# Patient Record
Sex: Male | Born: 1945 | Race: White | Hispanic: No | Smoking: Former smoker
Health system: Southern US, Community
[De-identification: ages and names within clinical notes are randomized; demographics above are authoritative.]

## PROBLEM LIST (undated history)

## (undated) ENCOUNTER — Emergency Department (HOSPITAL_COMMUNITY): Payer: Medicare HMO

## (undated) DIAGNOSIS — R0600 Dyspnea, unspecified: Secondary | ICD-10-CM

## (undated) DIAGNOSIS — K429 Umbilical hernia without obstruction or gangrene: Secondary | ICD-10-CM

## (undated) DIAGNOSIS — T8859XA Other complications of anesthesia, initial encounter: Secondary | ICD-10-CM

## (undated) DIAGNOSIS — G2581 Restless legs syndrome: Secondary | ICD-10-CM

## (undated) DIAGNOSIS — T4145XA Adverse effect of unspecified anesthetic, initial encounter: Secondary | ICD-10-CM

## (undated) DIAGNOSIS — J45909 Unspecified asthma, uncomplicated: Secondary | ICD-10-CM

## (undated) DIAGNOSIS — J310 Chronic rhinitis: Secondary | ICD-10-CM

## (undated) HISTORY — PX: EYE SURGERY: SHX253

## (undated) HISTORY — PX: ADENOIDECTOMY: SUR15

## (undated) HISTORY — DX: Dyspnea, unspecified: R06.00

## (undated) HISTORY — DX: Chronic rhinitis: J31.0

## (undated) HISTORY — PX: TONSILLECTOMY: SUR1361

---
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
## 2000-12-07 ENCOUNTER — Emergency Department (HOSPITAL_COMMUNITY): Admission: EM | Admit: 2000-12-07 | Discharge: 2000-12-08 | Payer: Self-pay

## 2000-12-09 ENCOUNTER — Encounter: Admission: RE | Admit: 2000-12-09 | Discharge: 2000-12-09 | Payer: Self-pay | Admitting: Urology

## 2000-12-09 ENCOUNTER — Encounter: Payer: Self-pay | Admitting: Urology

## 2000-12-11 ENCOUNTER — Encounter: Payer: Self-pay | Admitting: Urology

## 2000-12-12 ENCOUNTER — Ambulatory Visit (HOSPITAL_COMMUNITY): Admission: RE | Admit: 2000-12-12 | Discharge: 2000-12-12 | Payer: Self-pay | Admitting: Urology

## 2000-12-12 ENCOUNTER — Encounter: Payer: Self-pay | Admitting: Urology

## 2000-12-17 ENCOUNTER — Encounter: Admission: RE | Admit: 2000-12-17 | Discharge: 2000-12-17 | Payer: Self-pay | Admitting: Urology

## 2000-12-17 ENCOUNTER — Encounter: Payer: Self-pay | Admitting: Urology

## 2000-12-18 ENCOUNTER — Ambulatory Visit (HOSPITAL_COMMUNITY): Admission: RE | Admit: 2000-12-18 | Discharge: 2000-12-18 | Payer: Self-pay | Admitting: Urology

## 2000-12-31 ENCOUNTER — Encounter: Admission: RE | Admit: 2000-12-31 | Discharge: 2000-12-31 | Payer: Self-pay | Admitting: Urology

## 2000-12-31 ENCOUNTER — Encounter: Payer: Self-pay | Admitting: Urology

## 2001-01-10 ENCOUNTER — Encounter: Payer: Self-pay | Admitting: Urology

## 2001-01-10 ENCOUNTER — Encounter: Admission: RE | Admit: 2001-01-10 | Discharge: 2001-01-10 | Payer: Self-pay | Admitting: Urology

## 2001-10-22 ENCOUNTER — Encounter: Admission: RE | Admit: 2001-10-22 | Discharge: 2001-10-22 | Payer: Self-pay | Admitting: Urology

## 2001-10-22 ENCOUNTER — Encounter: Payer: Self-pay | Admitting: Urology

## 2002-05-26 ENCOUNTER — Encounter: Payer: Self-pay | Admitting: Family Medicine

## 2002-05-26 ENCOUNTER — Ambulatory Visit (HOSPITAL_COMMUNITY): Admission: RE | Admit: 2002-05-26 | Discharge: 2002-05-26 | Payer: Self-pay | Admitting: Family Medicine

## 2002-06-08 ENCOUNTER — Encounter: Payer: Self-pay | Admitting: Family Medicine

## 2002-06-08 ENCOUNTER — Ambulatory Visit (HOSPITAL_COMMUNITY): Admission: RE | Admit: 2002-06-08 | Discharge: 2002-06-08 | Payer: Self-pay | Admitting: Family Medicine

## 2009-03-25 ENCOUNTER — Ambulatory Visit (HOSPITAL_COMMUNITY): Admission: RE | Admit: 2009-03-25 | Discharge: 2009-03-25 | Payer: Self-pay | Admitting: Family Medicine

## 2010-09-22 NOTE — Op Note (Signed)
[facility]  Patient:    Joe Gonzales, Joe Gonzales                   MRN: [id number] Proc. Date: 12/18/00 Adm. Date:  [id number] Attending:  Thermon Leyland                           Operative Report  PREOPERATIVE DIAGNOSIS:  Left proximal to mid ureteral calculus.  POSTOPERATIVE DIAGNOSIS:  Left proximal to mid urethral calculus.  PROCEDURE PERFORMED:  Cystoscopy, ureteroscopy, holmium laser lithotripsy, stone-basketing of fragments, and double-J stent placement.  SURGEON:  Barron Alvine, M.D.  ANESTHESIA:  General.  INDICATIONS:  [name] is a 65 year old male.  He presented with significant left flank pain and had a 6 mm left UPJ stone.  He subsequently had lithotripsy, but the stone did not fragment.  It has subsequently migrated distally a few centimeters, and he has continued to have significant discomfort.  We discussed with him the chance of spontaneous passage, and he elected to proceed with ureteroscopy and holmium laser lithotripsy.  TECHNIQUE AND FINDINGS:  The patient was brought to the operating room where he underwent successful induction of general anesthesia.  He was placed in the lithotomy position, and prepped and draped in the usual manner.  Cystoscopy revealed unremarkable bladder.  I did not perform retrograde pyelography because I could easily see the stone with fluoroscopy.  With the guidewire, we were able to gently place the Glidewire past the stone and into the renal pelvis.  The distal ureter required some dilation with a 10 cm balloon dilator.  We had initially attempted to engage the ureter but found it to be too tight to allow for advancement of the scope.  We were then able to use the 6.5 [place] long rigid ureteroscope.  The stone itself was encountered in the proximal ureter.  The stone was approximately 6 mm in size.  The appearance of the stone was consistent with a calcium oxalate monohydrated stone.   Holmium laser lithotripsy was then used to break the stone into about 6-8 pieces.  The largest of these were basketed.  Approximately 4-5 of these pieces were basketed, each measuring 3-4 mm in size.  No addition fragments remained other than the occasional little piece, 1 mm or less in size.  No obvious injury to ureter occurred, and the procedure went very smoothly.  Over the guidewire, a 7 French 24 cm double-J stent was placed.  The patient was brought to the recovery room in stable condition. DD:  12/18/00 TD:  12/18/00 Job: [id number]

## 2014-09-10 ENCOUNTER — Other Ambulatory Visit: Payer: Self-pay | Admitting: Family Medicine

## 2014-09-10 DIAGNOSIS — Z139 Encounter for screening, unspecified: Secondary | ICD-10-CM

## 2014-09-17 ENCOUNTER — Ambulatory Visit: Payer: Self-pay

## 2014-09-24 ENCOUNTER — Other Ambulatory Visit: Payer: Self-pay | Admitting: Family Medicine

## 2014-09-24 ENCOUNTER — Ambulatory Visit
Admission: RE | Admit: 2014-09-24 | Discharge: 2014-09-24 | Disposition: A | Discharge status: Home / Self Care | Payer: Self-pay | Source: Ambulatory Visit | Attending: Family Medicine | Admitting: Family Medicine

## 2014-09-24 DIAGNOSIS — Z139 Encounter for screening, unspecified: Secondary | ICD-10-CM

## 2015-09-13 ENCOUNTER — Other Ambulatory Visit: Payer: Self-pay | Admitting: Family Medicine

## 2015-09-13 DIAGNOSIS — M545 Low back pain: Secondary | ICD-10-CM

## 2015-09-15 ENCOUNTER — Ambulatory Visit
Admission: RE | Admit: 2015-09-15 | Discharge: 2015-09-15 | Disposition: A | Discharge status: Home / Self Care | Payer: Medicare HMO | Source: Ambulatory Visit | Attending: Family Medicine | Admitting: Family Medicine

## 2015-09-15 DIAGNOSIS — M545 Low back pain: Secondary | ICD-10-CM

## 2016-05-15 DIAGNOSIS — D225 Melanocytic nevi of trunk: Secondary | ICD-10-CM | POA: Diagnosis not present

## 2016-05-15 DIAGNOSIS — Z86018 Personal history of other benign neoplasm: Secondary | ICD-10-CM | POA: Diagnosis not present

## 2016-05-15 DIAGNOSIS — D2262 Melanocytic nevi of left upper limb, including shoulder: Secondary | ICD-10-CM | POA: Diagnosis not present

## 2016-05-15 DIAGNOSIS — L821 Other seborrheic keratosis: Secondary | ICD-10-CM | POA: Diagnosis not present

## 2016-05-15 DIAGNOSIS — D2230 Melanocytic nevi of unspecified part of face: Secondary | ICD-10-CM | POA: Diagnosis not present

## 2016-05-15 DIAGNOSIS — Z85828 Personal history of other malignant neoplasm of skin: Secondary | ICD-10-CM | POA: Diagnosis not present

## 2016-05-15 DIAGNOSIS — Z23 Encounter for immunization: Secondary | ICD-10-CM | POA: Diagnosis not present

## 2016-05-28 DIAGNOSIS — M9900 Segmental and somatic dysfunction of head region: Secondary | ICD-10-CM | POA: Diagnosis not present

## 2016-05-28 DIAGNOSIS — M4803 Spinal stenosis, cervicothoracic region: Secondary | ICD-10-CM | POA: Diagnosis not present

## 2016-05-28 DIAGNOSIS — M9905 Segmental and somatic dysfunction of pelvic region: Secondary | ICD-10-CM | POA: Diagnosis not present

## 2016-05-28 DIAGNOSIS — M9903 Segmental and somatic dysfunction of lumbar region: Secondary | ICD-10-CM | POA: Diagnosis not present

## 2016-08-29 DIAGNOSIS — G2581 Restless legs syndrome: Secondary | ICD-10-CM | POA: Diagnosis not present

## 2016-08-29 DIAGNOSIS — Z6827 Body mass index (BMI) 27.0-27.9, adult: Secondary | ICD-10-CM | POA: Diagnosis not present

## 2016-08-29 DIAGNOSIS — M5135 Other intervertebral disc degeneration, thoracolumbar region: Secondary | ICD-10-CM | POA: Diagnosis not present

## 2016-08-29 DIAGNOSIS — Z79899 Other long term (current) drug therapy: Secondary | ICD-10-CM | POA: Diagnosis not present

## 2016-08-29 DIAGNOSIS — M48061 Spinal stenosis, lumbar region without neurogenic claudication: Secondary | ICD-10-CM | POA: Diagnosis not present

## 2016-08-29 DIAGNOSIS — R292 Abnormal reflex: Secondary | ICD-10-CM | POA: Diagnosis not present

## 2016-08-29 DIAGNOSIS — Z0000 Encounter for general adult medical examination without abnormal findings: Secondary | ICD-10-CM | POA: Diagnosis not present

## 2016-08-29 DIAGNOSIS — Z791 Long term (current) use of non-steroidal anti-inflammatories (NSAID): Secondary | ICD-10-CM | POA: Diagnosis not present

## 2016-08-29 DIAGNOSIS — H9110 Presbycusis, unspecified ear: Secondary | ICD-10-CM | POA: Diagnosis not present

## 2016-08-29 DIAGNOSIS — J309 Allergic rhinitis, unspecified: Secondary | ICD-10-CM | POA: Diagnosis not present

## 2016-09-14 DIAGNOSIS — M150 Primary generalized (osteo)arthritis: Secondary | ICD-10-CM

## 2016-09-14 DIAGNOSIS — M8949 Other hypertrophic osteoarthropathy, multiple sites: Secondary | ICD-10-CM | POA: Insufficient documentation

## 2016-09-14 DIAGNOSIS — M159 Polyosteoarthritis, unspecified: Secondary | ICD-10-CM | POA: Insufficient documentation

## 2016-09-14 DIAGNOSIS — G2581 Restless legs syndrome: Secondary | ICD-10-CM | POA: Insufficient documentation

## 2016-09-14 DIAGNOSIS — Z7721 Contact with and (suspected) exposure to potentially hazardous body fluids: Secondary | ICD-10-CM | POA: Diagnosis not present

## 2016-09-14 DIAGNOSIS — Z0000 Encounter for general adult medical examination without abnormal findings: Secondary | ICD-10-CM | POA: Diagnosis not present

## 2016-09-14 DIAGNOSIS — Z8249 Family history of ischemic heart disease and other diseases of the circulatory system: Secondary | ICD-10-CM | POA: Diagnosis not present

## 2016-09-14 DIAGNOSIS — Z1211 Encounter for screening for malignant neoplasm of colon: Secondary | ICD-10-CM | POA: Diagnosis not present

## 2016-09-14 DIAGNOSIS — E559 Vitamin D deficiency, unspecified: Secondary | ICD-10-CM | POA: Diagnosis not present

## 2016-10-13 DIAGNOSIS — Z8249 Family history of ischemic heart disease and other diseases of the circulatory system: Secondary | ICD-10-CM | POA: Diagnosis not present

## 2016-10-19 DIAGNOSIS — D125 Benign neoplasm of sigmoid colon: Secondary | ICD-10-CM | POA: Diagnosis not present

## 2016-10-19 DIAGNOSIS — Z86010 Personal history of colonic polyps: Secondary | ICD-10-CM | POA: Diagnosis not present

## 2016-11-17 DIAGNOSIS — Z0100 Encounter for examination of eyes and vision without abnormal findings: Secondary | ICD-10-CM | POA: Diagnosis not present

## 2016-11-21 DIAGNOSIS — D225 Melanocytic nevi of trunk: Secondary | ICD-10-CM | POA: Diagnosis not present

## 2016-11-21 DIAGNOSIS — Z808 Family history of malignant neoplasm of other organs or systems: Secondary | ICD-10-CM | POA: Diagnosis not present

## 2016-11-21 DIAGNOSIS — D2230 Melanocytic nevi of unspecified part of face: Secondary | ICD-10-CM | POA: Diagnosis not present

## 2016-11-21 DIAGNOSIS — L821 Other seborrheic keratosis: Secondary | ICD-10-CM | POA: Diagnosis not present

## 2016-11-21 DIAGNOSIS — D2262 Melanocytic nevi of left upper limb, including shoulder: Secondary | ICD-10-CM | POA: Diagnosis not present

## 2017-02-28 DIAGNOSIS — J209 Acute bronchitis, unspecified: Secondary | ICD-10-CM | POA: Diagnosis not present

## 2017-04-11 DIAGNOSIS — R69 Illness, unspecified: Secondary | ICD-10-CM | POA: Diagnosis not present

## 2017-04-11 DIAGNOSIS — F411 Generalized anxiety disorder: Secondary | ICD-10-CM | POA: Diagnosis not present

## 2017-04-25 DIAGNOSIS — F411 Generalized anxiety disorder: Secondary | ICD-10-CM | POA: Diagnosis not present

## 2017-04-25 DIAGNOSIS — R69 Illness, unspecified: Secondary | ICD-10-CM | POA: Diagnosis not present

## 2018-07-03 ENCOUNTER — Ambulatory Visit: Payer: Medicare Other | Admitting: Allergy & Immunology

## 2018-07-03 ENCOUNTER — Encounter: Payer: Self-pay | Admitting: Allergy & Immunology

## 2018-07-03 VITALS — BP 126/70 | HR 77 | Resp 16 | Ht 67.0 in | Wt 201.8 lb

## 2018-07-03 DIAGNOSIS — J4540 Moderate persistent asthma, uncomplicated: Secondary | ICD-10-CM | POA: Diagnosis not present

## 2018-07-03 DIAGNOSIS — J310 Chronic rhinitis: Secondary | ICD-10-CM | POA: Diagnosis not present

## 2018-07-03 MED ORDER — BUDESONIDE-FORMOTEROL FUMARATE 160-4.5 MCG/ACT IN AERO: 2.0000 | INHALATION_SPRAY | Freq: Two times a day (BID) | RESPIRATORY_TRACT | 5 refills | Status: DC

## 2018-07-03 MED ORDER — FLUTICASONE PROPIONATE 50 MCG/ACT NA SUSP: 2.0000 | Freq: Every day | NASAL | 5 refills | Status: DC

## 2018-07-03 MED ORDER — ALBUTEROL SULFATE HFA 108 (90 BASE) MCG/ACT IN AERS: 1.0000 | INHALATION_SPRAY | RESPIRATORY_TRACT | 1 refills | Status: AC | PRN

## 2018-07-03 NOTE — Patient Instructions (Addendum)
1. Possible asthma  - Lung testing looked good today and it did not improve much with the nebulizer treatment. - You did feel better with the ProAir in the past, which does point towards asthma. - However, I think a trial of an asthma medication would be useful to see how things go.  - We are going to start you on Symbicort two puffs twice daily. - Spacer sample and demonstration provided. - Daily controller medication(s): Symbicort 160/4.71mcg two puffs twice daily with spacer - Prior to physical activity: ProAir 2 puffs 10-15 minutes before physical activity. - Rescue medications: ProAir 4 puffs every 4-6 hours as needed - Asthma control goals:  * Full participation in all desired activities (may need albuterol before activity) * Albuterol use two time or less a week on average (not counting use with activity) * Cough interfering with sleep two time or less a month * Oral steroids no more than once a year * No hospitalizations  2. Chronic rhinitis - We could not do testing since the histamine was non-reactive. - We are going to do testing on March 4th at 9:00am with Gareth Morgan our NP - Stop taking: Allegra for now - Continue with: Flonase (fluticasone) one spray per nostril daily - We can discuss additional treatment options once we have the testing in hand.   3. Return in about 6 days (around 07/09/2018) for SKIN TESTING (9AM).   Please inform us of any Emergency Department visits, hospitalizations, or changes in symptoms. Call us before going to the ED for breathing or allergy symptoms since we might be able to fit you in for a sick visit. Feel free to contact us anytime with any questions, problems, or concerns.  It was a pleasure to meet you today!  Websites that have reliable patient information: 1. American Academy of Asthma, Allergy, and Immunology: www.aaaai.org 2. Food Allergy Research and Education (FARE): foodallergy.org 3. Mothers of Asthmatics:  http://www.asthmacommunitynetwork.org 4. American College of Allergy, Asthma, and Immunology: MonthlyElectricBill.co.uk   Make sure you are registered to vote! If you have moved or changed any of your contact information, you will need to get this updated before voting!    Voter ID laws are NOT going into effect for the General Election in November 2020! DO NOT let this stop you from exercising your right to vote!

## 2018-07-03 NOTE — Progress Notes (Signed)
NEW PATIENT  Date of Service/Encounter:  07/03/18  Referring provider: Chesley Noon, MD   Assessment:   Shortness of breath - possible asthma  Chronic rhinitis   Likely tannin-mediated congestion with red wine exposure   Plan/Recommendations:   1. Possible asthma  - Lung testing looked good today and it did not improve much with the nebulizer treatment. - You did feel better with the ProAir in the past, which does point towards asthma. - However, I think a trial of an asthma medication would be useful to see how things go.  - We are going to start you on Symbicort two puffs twice daily. - Spacer sample and demonstration provided. - Daily controller medication(s): Symbicort 160/4.64mcg two puffs twice daily with spacer - Prior to physical activity: ProAir 2 puffs 10-15 minutes before physical activity. - Rescue medications: ProAir 4 puffs every 4-6 hours as needed - Asthma control goals:  * Full participation in all desired activities (may need albuterol before activity) * Albuterol use two time or less a week on average (not counting use with activity) * Cough interfering with sleep two time or less a month * Oral steroids no more than once a year * No hospitalizations  2. Chronic rhinitis - We could not do testing since the histamine was non-reactive. - We are going to do testing on March 4th at 9:00am with Gareth Morgan our NP - Stop taking: Allegra for now - Continue with: Flonase (fluticasone) one spray per nostril daily - We can discuss additional treatment options once we have the testing in hand.   3. Return in about 6 days (around 07/09/2018) for SKIN TESTING (9AM).   Subjective:   Joe Gonzales is a 73 y.o. male presenting today for evaluation of  Chief Complaint  Patient presents with  . Chest Pain  . Shortness of Breath  . Cough  . Allergic Rhinitis     Joe Gonzales has a history of the following: Patient Active Problem List   Diagnosis Date Noted  . Chronic rhinitis 07/03/2018  . Moderate persistent asthma, uncomplicated 66/44/0347    History obtained from: chart review and patient.  Joe Gonzales was referred by Chesley Noon, MD.     Joe Gonzales is a 73 y.o. male presenting for an evaluation of shortness of breath and allergies.   Asthma/Respiratory Symptom History: He has never been diagnosed officially with asthma.  He was a smoker himself for 4 to 5 years, but quit around 38 years ago.  He was also around quite a bit of cigarette smoke growing up, as his parents were "chain smokers".  As a child, he had bronchitis multiple times throughout the year.  He also had pleurisy twice as a Electronics engineer.  Over time, his pulmonary issues have calmed down.  He does not remember ever having an inhaler as a child.  He did get prednisone quite a bit during these episodes of chronic bronchitis as a child.  However, in his 33s, 59s, 54s, and 27s he has done very well.  His current symptoms started in October 2019.  He tells me that he contracted a cold which never seem to get better.  He reports that he has right sided pressure in his chest such that he cannot take a deep breath.  He is also had 2 to 3 months of throat clearing with a dry cough.  He has been treated with prednisone tapers on 2 different occasions since October.  He  initially has improvement with the first couple of days of steroids, but as they are tapered his symptoms get worse.  He did get an albuterol inhaler which he is used up.  It was providing some relief.  He has never been given a maintenance inhaler.  He thinks that he sleeps well at night, although with the onset of symptoms he has been unable to go to the gym and has put on a little bit of weight.  He feels that the extra weight is not helping his situation at all.  Allergic Rhinitis Symptom History: He does have a history of allergic rhinitis.  He reports that this started when he moved from [place] to [place] in 1995.  Specifically, when he mows his lawn outside, he has copious sneezing and clear rhinorrhea.  He has never been tested in the past, but his symptoms have responded well to over-the-counter antihistamines.  Currently he is on fexofenadine.  He has intermittent congestion and is never used a nasal spray.  He does not get sick very often, typically getting 1 cold per year that does not require antibiotics often.  He does not think that he has any problems with exposure to animals.  Most of his symptoms have been centered around the spring and summer season, but over time they have become more year-round.  He does report nasal congestion with red wines, otherwise he tolerates foods without a problem.  Otherwise, there is no history of other atopic diseases, including food allergies, drug allergies, stinging insect allergies, eczema, urticaria or contact dermatitis. There is no significant infectious history. Vaccinations are up to date.    Past Medical History: Patient Active Problem List   Diagnosis Date Noted  . Chronic rhinitis 07/03/2018  . Moderate persistent asthma, uncomplicated 02/72/5366    Medication List:  Allergies as of 07/03/2018   No Known Allergies     Medication List       Accurate as of July 03, 2018 10:57 AM. Always use your most recent med list.        fluticasone 50 MCG/ACT nasal spray Commonly known as:  FLONASE USE 2 SPRAY(S) IN EACH NOSTRIL ONCE DAILY   meloxicam 15 MG tablet Commonly known as:  MOBIC Take by mouth.   rOPINIRole 3 MG tablet Commonly known as:  REQUIP Take 3 mg by mouth at bedtime.   Vitamin D 50 MCG (2000 UT) Caps Take by mouth.       Birth History: non-contributory  Developmental History: non-contributory.   Past Surgical History: Past Surgical History:  Procedure Laterality Date  . ADENOIDECTOMY    . EYE SURGERY    . TONSILLECTOMY       Family History: History reviewed. No pertinent  family history.   Social History: Joe Gonzales lives at home by himself.  He is divorced.  He lives in a house that is 72 years old.  There is hardwood in the main living areas and carpeting in the bedrooms.  He has gas heating and central cooling.  There cats outside of the home, who belonged to the neighbors.  There is no current tobacco exposure.  He does have dust mite covers on his bedding.  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  He retired for a total of 2 months but then became bored.   Review of Systems  Constitutional: Negative.  Negative for fever,  malaise/fatigue and weight loss.  HENT: Negative.  Negative for congestion, ear discharge and ear pain.   Eyes: Negative for pain, discharge and redness.  Respiratory: Positive for cough and shortness of breath. Negative for sputum production and wheezing.        Positive for right-sided chest pain with deep inspiration.  Cardiovascular: Negative.  Negative for chest pain and palpitations.  Gastrointestinal: Negative for abdominal pain and heartburn.  Skin: Negative.  Negative for itching and rash.  Neurological: Negative for dizziness and headaches.  Endo/Heme/Allergies: Positive for environmental allergies. Does not bruise/bleed easily.       Objective:   Blood pressure 126/70, pulse 77, resp. rate 16, height 5\' 7"  (1.702 m), weight 201 lb 12.8 oz (91.5 kg), SpO2 98 %. Body mass index is 31.61 kg/m.   Physical Exam:   Physical Exam  Constitutional: He appears well-developed.  HENT:  Head: Normocephalic and atraumatic.  Right Ear: Tympanic membrane, external ear and ear canal normal. No drainage, swelling or tenderness. Tympanic membrane is not injected, not scarred, not erythematous, not retracted and not bulging.  Left Ear: Tympanic membrane, external ear and ear canal normal. No drainage, swelling or tenderness. Tympanic membrane is not  injected, not scarred, not erythematous, not retracted and not bulging.  Nose: No mucosal edema, rhinorrhea, nasal deformity or septal deviation. No epistaxis. Right sinus exhibits no maxillary sinus tenderness and no frontal sinus tenderness. Left sinus exhibits no maxillary sinus tenderness and no frontal sinus tenderness.  Mouth/Throat: Uvula is midline and oropharynx is clear and moist. Mucous membranes are not pale and not dry.  Eyes: Pupils are equal, round, and reactive to light. Conjunctivae and EOM are normal. Right eye exhibits no chemosis and no discharge. Left eye exhibits no chemosis and no discharge. Right conjunctiva is not injected. Left conjunctiva is not injected.  Cardiovascular: Normal rate, regular rhythm and normal heart sounds.  Respiratory: Effort normal and breath sounds normal. No accessory muscle usage. No tachypnea. No respiratory distress. He has no wheezes. He has no rhonchi. He has no rales. He exhibits no tenderness.  Moving air fairly well in all lung fields.  No increased work of breathing.  GI: There is no abdominal tenderness. There is no rebound and no guarding.  Lymphadenopathy:       Head (right side): No submandibular, no tonsillar and no occipital adenopathy present.       Head (left side): No submandibular, no tonsillar and no occipital adenopathy present.    He has no cervical adenopathy.  Neurological: He is alert.  Skin: No abrasion, no petechiae and no rash noted. Rash is not papular, not vesicular and not urticarial. No erythema. No pallor.  Psychiatric: He has a normal mood and affect.     Diagnostic studies:    Spirometry: results normal (FEV1: 2.59/91%, FVC: 3.29/85%, FEV1/FVC: 79%).    Spirometry consistent with normal pattern. Xopenex/Atrovent nebulizer treatment given in clinic with no improvement.  Allergy Studies: none (deferred because histamine was non reactive)          Salvatore Marvel, MD [facility]

## 2018-07-09 ENCOUNTER — Ambulatory Visit: Payer: Medicare Other | Admitting: Family Medicine

## 2018-07-09 ENCOUNTER — Encounter: Payer: Self-pay | Admitting: Family Medicine

## 2018-07-09 VITALS — BP 114/68 | HR 70 | Temp 98.1°F | Resp 16 | Ht 67.0 in | Wt 201.7 lb

## 2018-07-09 DIAGNOSIS — J3089 Other allergic rhinitis: Secondary | ICD-10-CM

## 2018-07-09 DIAGNOSIS — J302 Other seasonal allergic rhinitis: Secondary | ICD-10-CM | POA: Diagnosis not present

## 2018-07-09 DIAGNOSIS — J4540 Moderate persistent asthma, uncomplicated: Secondary | ICD-10-CM | POA: Diagnosis not present

## 2018-07-09 MED ORDER — AZELASTINE HCL 0.1 % NA SOLN: 2.0000 | Freq: Two times a day (BID) | NASAL | 5 refills | Status: DC

## 2018-07-09 NOTE — Progress Notes (Addendum)
[address] [place]  [address] Dept: [phone]  FOLLOW UP NOTE  Patient ID: Joe Gonzales, male    DOB: [birth year]  Age: 73 y.o. MRN: [id number] Date of Office Visit: 07/09/2018  Assessment  Chief Complaint: Allergy Testing  HPI Joe Gonzales is a 73 year old male who presents to the clinic for a follow up visit. He was last seen in this clinic on 07/03/2018 by Dr. Ernst Bowler for evaluation of asthma, allergic rhinitis, and tannin mediated congestion. He reports, since that time, his breathing has improved slightly while using Symbicort 160-2 puffs twice a day with a spacer. He continues to experience some shortness of breath with activity and dry cough. He reports that, during activity, his right lung feels as though there is a hand squeezing it from the front and the back and during these times he experiences a wheezy cough. Allergic rhinitis occurs year round with a seasonal variation with symptoms including clear runny nose and frequent throat clearing. His current medications are listed in the chart.    Drug Allergies:  No Known Allergies  Physical Exam: BP 114/68 (BP Location: Right Arm, Patient Position: Sitting, Cuff Size: Normal)   Pulse 70   Temp 98.1 F (36.7 C) (Oral)   Resp 16   Ht 5\' 7"  (1.702 m)   Wt 201 lb 11.5 oz (91.5 kg)   SpO2 94%   BMI 31.59 kg/m    Physical Exam Vitals signs reviewed.  Constitutional:      Appearance: Normal appearance.  HENT:     Head: Normocephalic and atraumatic.     Right Ear: Tympanic membrane normal.     Left Ear: Tympanic membrane normal.     Nose:     Comments: Bilateral nares normal. Pharynx normal. Ears normal. Eyes normal.    Mouth/Throat:     Pharynx: Oropharynx is clear.  Eyes:     Conjunctiva/sclera: Conjunctivae normal.  Neck:     Musculoskeletal: Normal range of motion and neck supple.  Cardiovascular:     Rate and Rhythm: Normal rate and regular rhythm.     Heart sounds: Normal heart  sounds. No murmur.  Pulmonary:     Effort: Pulmonary effort is normal.     Breath sounds: Normal breath sounds.     Comments: Lungs clear to auscultation Musculoskeletal: Normal range of motion.  Skin:    General: Skin is warm and dry.  Neurological:     Mental Status: He is alert and oriented to person, place, and time.  Psychiatric:        Mood and Affect: Mood normal.        Behavior: Behavior normal.        Thought Content: Thought content normal.        Judgment: Judgment normal.     Diagnostics: FVC 3.58, FEV1 2.66. Predicted FVC 3.88, predicted FEV1 2.84. Spirometry indicates normal ventilatory function.  Percutaneous environmental skin testing was negative with adequate controls.  Intradermal environmental skin testing was positive to mold mix 1, mold mix 2, and mold mix 4 with adequate controls.   Assessment and Plan: 1. Moderate persistent asthma, uncomplicated   2. Seasonal and perennial allergic rhinitis     Meds ordered this encounter  Medications  . azelastine (ASTELIN) 0.1 % nasal spray    Sig: Place 2 sprays into both nostrils 2 (two) times daily.    Dispense:  30 mL    Refill:  5    Patient Instructions  Moderate persistent asthma, uncomplicated  Continue Symbicort 160-2 puffs twice a day with a spacer to prevent cough or wheeze Continue albuterol (ProAir) 2 puffs every 4 hours as needed for cough or wheeze Use ProAir 2 puffs 5-15 minutes before activity to reduce cough and wheeze  Chronic rhinitis Your skin testing to environmental allergens today was positive to indoor and outdoor molds Allergen avoidance measures given Begin azelastine 2 sprays twice a day as needed for a runny nose Continue Allerga 180 mg once a day as needed for a runny nose Continue Flonase 2 sprays in each nostril once a day as needed for a stuffy nose Consider allergy injections if your symptoms are not well controlled with medications  Call us if this treatment plan is not  working well for you  Follow up in 2 months or sooner if needed  Control of Mold Allergen  Mold and fungi can grow on a variety of surfaces provided certain temperature and moisture conditions exist.  Outdoor molds grow on plants, decaying vegetation and soil.  The major outdoor mold, Alternaria and Cladosporium, are found in very high numbers during hot and dry conditions.  Generally, a late Summer - Fall peak is seen for common outdoor fungal spores.  Rain will temporarily lower outdoor mold spore count, but counts rise rapidly when the rainy period ends.  The most important indoor molds are Aspergillus and Penicillium.  Dark, humid and poorly ventilated basements are ideal sites for mold growth.  The next most common sites of mold growth are the bathroom and the kitchen.  Outdoor [employer] 1. Use air conditioning and keep windows closed 2. Avoid exposure to decaying vegetation. 3. Avoid leaf raking. 4. Avoid grain handling. 5. Consider wearing a face mask if working in moldy areas.  Indoor Mold Control 1. Maintain humidity below 50%. 2. Clean washable surfaces with 5% bleach solution. 3. Remove sources e.g. Contaminated carpets.    Return in about 2 months (around 09/08/2018), or if symptoms worsen or fail to improve.    Thank you for the opportunity to care for this patient.  Please do not hesitate to contact me with questions.  Gareth Morgan, FNP [facility]  _________________________________________________  I have provided oversight concerning [facility]'s evaluation and treatment of this patient's health issues addressed during today's encounter.  I agree with the assessment and therapeutic plan as outlined in the note.   Signed,   R Edgar Frisk, MD

## 2018-07-09 NOTE — Patient Instructions (Addendum)
Moderate persistent asthma, uncomplicated  Continue Symbicort 160-2 puffs twice a day with a spacer to prevent cough or wheeze Continue albuterol (ProAir) 2 puffs every 4 hours as needed for cough or wheeze Use ProAir 2 puffs 5-15 minutes before activity to reduce cough and wheeze  Chronic rhinitis Your skin testing to environmental allergens today was positive to indoor and outdoor molds Allergen avoidance measures given Begin azelastine 2 sprays twice a day as needed for a runny nose Continue Allerga 180 mg once a day as needed for a runny nose Continue Flonase 2 sprays in each nostril once a day as needed for a stuffy nose Consider allergy injections if your symptoms are not well controlled with medications  Call us if this treatment plan is not working well for you  Follow up in 2 months or sooner if needed  Control of Mold Allergen  Mold and fungi can grow on a variety of surfaces provided certain temperature and moisture conditions exist.  Outdoor molds grow on plants, decaying vegetation and soil.  The major outdoor mold, Alternaria and Cladosporium, are found in very high numbers during hot and dry conditions.  Generally, a late Summer - Fall peak is seen for common outdoor fungal spores.  Rain will temporarily lower outdoor mold spore count, but counts rise rapidly when the rainy period ends.  The most important indoor molds are Aspergillus and Penicillium.  Dark, humid and poorly ventilated basements are ideal sites for mold growth.  The next most common sites of mold growth are the bathroom and the kitchen.  Outdoor [employer] 1. Use air conditioning and keep windows closed 2. Avoid exposure to decaying vegetation. 3. Avoid leaf raking. 4. Avoid grain handling. 5. Consider wearing a face mask if working in moldy areas.  Indoor Mold Control 1. Maintain humidity below 50%. 2. Clean washable surfaces with 5% bleach solution. 3. Remove sources e.g. Contaminated carpets.

## 2018-10-18 ENCOUNTER — Emergency Department (HOSPITAL_COMMUNITY): Payer: Medicare HMO

## 2018-10-18 ENCOUNTER — Observation Stay (HOSPITAL_COMMUNITY)
Admission: EM | Admit: 2018-10-18 | Discharge: 2018-10-20 | Disposition: A | Discharge status: Home / Self Care | Payer: Medicare HMO | Attending: General Surgery | Admitting: General Surgery

## 2018-10-18 ENCOUNTER — Other Ambulatory Visit: Payer: Self-pay

## 2018-10-18 ENCOUNTER — Encounter (HOSPITAL_COMMUNITY): Payer: Self-pay | Admitting: Pharmacy Technician

## 2018-10-18 DIAGNOSIS — J45909 Unspecified asthma, uncomplicated: Secondary | ICD-10-CM | POA: Diagnosis not present

## 2018-10-18 DIAGNOSIS — K812 Acute cholecystitis with chronic cholecystitis: Principal | ICD-10-CM | POA: Insufficient documentation

## 2018-10-18 DIAGNOSIS — M1990 Unspecified osteoarthritis, unspecified site: Secondary | ICD-10-CM | POA: Diagnosis not present

## 2018-10-18 DIAGNOSIS — R509 Fever, unspecified: Secondary | ICD-10-CM

## 2018-10-18 DIAGNOSIS — Z87891 Personal history of nicotine dependence: Secondary | ICD-10-CM | POA: Diagnosis not present

## 2018-10-18 DIAGNOSIS — Z791 Long term (current) use of non-steroidal anti-inflammatories (NSAID): Secondary | ICD-10-CM | POA: Diagnosis not present

## 2018-10-18 DIAGNOSIS — Z1159 Encounter for screening for other viral diseases: Secondary | ICD-10-CM | POA: Diagnosis not present

## 2018-10-18 DIAGNOSIS — K8000 Calculus of gallbladder with acute cholecystitis without obstruction: Secondary | ICD-10-CM | POA: Diagnosis present

## 2018-10-18 DIAGNOSIS — Z79899 Other long term (current) drug therapy: Secondary | ICD-10-CM | POA: Insufficient documentation

## 2018-10-18 DIAGNOSIS — K810 Acute cholecystitis: Secondary | ICD-10-CM

## 2018-10-18 DIAGNOSIS — R932 Abnormal findings on diagnostic imaging of liver and biliary tract: Secondary | ICD-10-CM

## 2018-10-18 LAB — LACTIC ACID, PLASMA: Lactic Acid, Venous: 1.5 mmol/L (ref 0.5–1.9)

## 2018-10-18 LAB — URINALYSIS, ROUTINE W REFLEX MICROSCOPIC
Bacteria, UA: NONE SEEN
Bilirubin Urine: NEGATIVE
Glucose, UA: NEGATIVE mg/dL
Ketones, ur: 20 mg/dL — AB
Leukocytes,Ua: NEGATIVE
Nitrite: NEGATIVE
Protein, ur: NEGATIVE mg/dL
Specific Gravity, Urine: 1.026 (ref 1.005–1.030)
pH: 5 (ref 5.0–8.0)

## 2018-10-18 LAB — CBC
HCT: 41 % (ref 39.0–52.0)
Hemoglobin: 13.9 g/dL (ref 13.0–17.0)
MCH: 30.8 pg (ref 26.0–34.0)
MCHC: 33.9 g/dL (ref 30.0–36.0)
MCV: 90.9 fL (ref 80.0–100.0)
Platelets: 247 10*3/uL (ref 150–400)
RBC: 4.51 MIL/uL (ref 4.22–5.81)
RDW: 13.2 % (ref 11.5–15.5)
WBC: 10.6 10*3/uL — ABNORMAL HIGH (ref 4.0–10.5)
nRBC: 0 % (ref 0.0–0.2)

## 2018-10-18 LAB — COMPREHENSIVE METABOLIC PANEL
ALT: 131 U/L — ABNORMAL HIGH (ref 0–44)
AST: 75 U/L — ABNORMAL HIGH (ref 15–41)
Albumin: 3.7 g/dL (ref 3.5–5.0)
Alkaline Phosphatase: 78 U/L (ref 38–126)
Anion gap: 10 (ref 5–15)
BUN: 13 mg/dL (ref 8–23)
CO2: 21 mmol/L — ABNORMAL LOW (ref 22–32)
Calcium: 8.7 mg/dL — ABNORMAL LOW (ref 8.9–10.3)
Chloride: 103 mmol/L (ref 98–111)
Creatinine, Ser: 1.21 mg/dL (ref 0.61–1.24)
GFR calc Af Amer: >60 mL/min (ref >60)
GFR calc non Af Amer: 60 mL/min — ABNORMAL LOW (ref >60)
Glucose, Bld: 114 mg/dL — ABNORMAL HIGH (ref 70–99)
Potassium: 3.4 mmol/L — ABNORMAL LOW (ref 3.5–5.1)
Sodium: 134 mmol/L — ABNORMAL LOW (ref 135–145)
Total Bilirubin: 1.4 mg/dL — ABNORMAL HIGH (ref 0.3–1.2)
Total Protein: 6.6 g/dL (ref 6.5–8.1)

## 2018-10-18 LAB — CBC WITH DIFFERENTIAL/PLATELET
Abs Immature Granulocytes: 0.04 10*3/uL (ref 0.00–0.07)
Basophils Absolute: 0 10*3/uL (ref 0.0–0.1)
Basophils Relative: 0 %
Eosinophils Absolute: 0 10*3/uL (ref 0.0–0.5)
Eosinophils Relative: 0 %
HCT: 44.4 % (ref 39.0–52.0)
Hemoglobin: 15.2 g/dL (ref 13.0–17.0)
Immature Granulocytes: 0 %
Lymphocytes Relative: 5 %
Lymphs Abs: 0.5 10*3/uL — ABNORMAL LOW (ref 0.7–4.0)
MCH: 30.9 pg (ref 26.0–34.0)
MCHC: 34.2 g/dL (ref 30.0–36.0)
MCV: 90.2 fL (ref 80.0–100.0)
Monocytes Absolute: 0.6 10*3/uL (ref 0.1–1.0)
Monocytes Relative: 6 %
Neutro Abs: 8.2 10*3/uL — ABNORMAL HIGH (ref 1.7–7.7)
Neutrophils Relative %: 89 %
Platelets: 225 10*3/uL (ref 150–400)
RBC: 4.92 MIL/uL (ref 4.22–5.81)
RDW: 13.1 % (ref 11.5–15.5)
WBC: 9.3 10*3/uL (ref 4.0–10.5)
nRBC: 0 % (ref 0.0–0.2)

## 2018-10-18 LAB — LIPASE, BLOOD: Lipase: 177 U/L — ABNORMAL HIGH (ref 11–51)

## 2018-10-18 LAB — SARS CORONAVIRUS 2: SARS Coronavirus 2: NOT DETECTED

## 2018-10-18 MED ORDER — FLUTICASONE PROPIONATE 50 MCG/ACT NA SUSP
2.0000 | Freq: Every day | NASAL | Status: DC
  Filled 2018-10-18: qty 16

## 2018-10-18 MED ORDER — ALBUTEROL SULFATE HFA 108 (90 BASE) MCG/ACT IN AERS
1.0000 | INHALATION_SPRAY | RESPIRATORY_TRACT | Status: DC | PRN
  Filled 2018-10-18: qty 6.7

## 2018-10-18 MED ORDER — SODIUM CHLORIDE 0.9 % IV BOLUS
1000.0000 mL | Freq: Once | INTRAVENOUS | Status: AC
  Administered 2018-10-18: 1000 mL via INTRAVENOUS

## 2018-10-18 MED ORDER — ACETAMINOPHEN 325 MG PO TABS
650.0000 mg | ORAL_TABLET | Freq: Once | ORAL | Status: AC
  Administered 2018-10-18: 650 mg via ORAL
  Filled 2018-10-18: qty 2

## 2018-10-18 MED ORDER — SODIUM CHLORIDE 0.9 % IV SOLN
2.0000 g | Freq: Once | INTRAVENOUS | Status: AC
  Administered 2018-10-18: 2 g via INTRAVENOUS
  Filled 2018-10-18: qty 20

## 2018-10-18 MED ORDER — SODIUM CHLORIDE 0.9 % IV SOLN
INTRAVENOUS | Status: DC
  Administered 2018-10-18 – 2018-10-19 (×2): via INTRAVENOUS

## 2018-10-18 MED ORDER — MORPHINE SULFATE (PF) 2 MG/ML IV SOLN: 2.0000 mg | INTRAVENOUS | Status: DC | PRN

## 2018-10-18 MED ORDER — IOHEXOL 300 MG/ML  SOLN
100.0000 mL | Freq: Once | INTRAMUSCULAR | Status: AC | PRN
  Administered 2018-10-18: 100 mL via INTRAVENOUS

## 2018-10-18 MED ORDER — ONDANSETRON HCL 4 MG/2ML IJ SOLN
4.0000 mg | Freq: Once | INTRAMUSCULAR | Status: AC
  Administered 2018-10-19: 4 mg via INTRAVENOUS

## 2018-10-18 MED ORDER — AZELASTINE HCL 0.1 % NA SOLN
2.0000 | Freq: Two times a day (BID) | NASAL | Status: DC
  Filled 2018-10-18: qty 30

## 2018-10-18 MED ORDER — ONDANSETRON HCL 4 MG/2ML IJ SOLN
4.0000 mg | Freq: Four times a day (QID) | INTRAMUSCULAR | Status: DC | PRN
  Administered 2018-10-19: 4 mg via INTRAVENOUS
  Filled 2018-10-18: qty 2

## 2018-10-18 MED ORDER — DIPHENHYDRAMINE HCL 12.5 MG/5ML PO ELIX: 12.5000 mg | ORAL_SOLUTION | Freq: Four times a day (QID) | ORAL | Status: DC | PRN

## 2018-10-18 MED ORDER — LORATADINE 10 MG PO TABS: 10.0000 mg | ORAL_TABLET | Freq: Every day | ORAL | Status: DC

## 2018-10-18 MED ORDER — ONDANSETRON 4 MG PO TBDP: 4.0000 mg | ORAL_TABLET | Freq: Four times a day (QID) | ORAL | Status: DC | PRN

## 2018-10-18 MED ORDER — MOMETASONE FURO-FORMOTEROL FUM 200-5 MCG/ACT IN AERO
2.0000 | INHALATION_SPRAY | Freq: Two times a day (BID) | RESPIRATORY_TRACT | Status: DC
  Filled 2018-10-18: qty 8.8

## 2018-10-18 MED ORDER — ENOXAPARIN SODIUM 40 MG/0.4ML ~~LOC~~ SOLN: 40.0000 mg | SUBCUTANEOUS | Status: DC

## 2018-10-18 MED ORDER — ROPINIROLE HCL 1 MG PO TABS
3.0000 mg | ORAL_TABLET | Freq: Every day | ORAL | Status: DC
  Administered 2018-10-18 – 2018-10-19 (×2): 3 mg via ORAL
  Filled 2018-10-18 (×2): qty 3

## 2018-10-18 MED ORDER — DIPHENHYDRAMINE HCL 50 MG/ML IJ SOLN: 12.5000 mg | Freq: Four times a day (QID) | INTRAMUSCULAR | Status: DC | PRN

## 2018-10-18 MED ORDER — ACETAMINOPHEN 325 MG PO TABS
650.0000 mg | ORAL_TABLET | Freq: Four times a day (QID) | ORAL | Status: DC | PRN
  Administered 2018-10-19: 650 mg via ORAL
  Filled 2018-10-18: qty 2

## 2018-10-18 MED ORDER — SODIUM CHLORIDE 0.9 % IV SOLN
2.0000 g | INTRAVENOUS | Status: DC
  Administered 2018-10-19: 2 g via INTRAVENOUS
  Filled 2018-10-18 (×2): qty 20

## 2018-10-18 MED ORDER — ACETAMINOPHEN 650 MG RE SUPP: 650.0000 mg | Freq: Four times a day (QID) | RECTAL | Status: DC | PRN

## 2018-10-18 NOTE — ED Provider Notes (Signed)
Medical screening examination/treatment/procedure(s) were conducted as a shared visit with non-physician practitioner(s) and myself.  I personally evaluated the patient during the encounter.    Develop fever nausea and abdominal pain.  No Vomiting.  Temperature got as high as 102.5.  Patient went to urgent care and was referred to the emergency department.  Patient is alert and appropriate.  No confusion.  No respiratory distress.  Movements are coordinated purposeful symmetric without difficulty.  Patient has CT consistent with cholecystitis.  I agree with plan of management.   Charlesetta Shanks, MD 10/18/18 1940

## 2018-10-18 NOTE — ED Provider Notes (Signed)
[facility] EMERGENCY DEPARTMENT Provider Note   CSN: [id number] Arrival date & time: 10/18/18  1651    History   Chief Complaint Chief Complaint  Patient presents with   Fever    HPI CLEMENT DENEAULT is a 73 y.o. male.     Shyler Holzman Kouba is a 73 y.o. male with a history of asthma and allergic rhinitis, who presents to the emergency department from urgent care for evaluation of fever, nausea and abdominal pain.  Patient reports yesterday he cooked some chicken at home a few hours after eating it he began feeling extremely nauseated and had severe lower abdominal cramping.  He reports he never had any vomiting and pain seemed to subside but nausea has persisted today.  He denies any diarrhea, constipation, melena or hematochezia.  He reports nausea is worse today and he has had poor appetite.  He took his temp at home and it was 102.5 so we went to urgent care, there he was found to be febrile, tachycardic with O2 saturation of 89% and so he was sent here for further evaluation.  He denies cough, chest pain or shortness of breath.  He does report that for the last several years he has noted an occasional tightness that he feels in his right lung, this is unchanged and not worse than usual.  He denies any known sick contacts he has gone out to the grocery store.  Denies any lower extremity swelling or pain.  No rashes.  No other aggravating or alleviating factors.  He did take 2 extra strength Tylenol prior to arrival.  No other meds to treat symptoms.     Past Medical History:  Diagnosis Date   Dyspnea    Rhinitis     Patient Active Problem List   Diagnosis Date Noted   Seasonal and perennial allergic rhinitis 07/09/2018   Chronic rhinitis 07/03/2018   Moderate persistent asthma, uncomplicated 35/00/9381   Primary osteoarthritis involving multiple joints 09/14/2016   RLS (restless legs syndrome) 09/14/2016    Past Surgical History:    Procedure Laterality Date   ADENOIDECTOMY     EYE SURGERY     TONSILLECTOMY          Home Medications    Prior to Admission medications   Medication Sig Start Date End Date Taking? Authorizing Provider  albuterol (PROAIR HFA) 108 (90 Base) MCG/ACT inhaler Inhale 1-2 puffs into the lungs every 4 (four) hours as needed for wheezing or shortness of breath. 07/03/18   Valentina Shaggy, MD  azelastine (ASTELIN) 0.1 % nasal spray Place 2 sprays into both nostrils 2 (two) times daily. 07/09/18   Dara Hoyer, FNP  budesonide-formoterol (SYMBICORT) 160-4.5 MCG/ACT inhaler Inhale 2 puffs into the lungs 2 (two) times daily. 07/03/18   Valentina Shaggy, MD  Cholecalciferol (VITAMIN D) 50 MCG (2000 UT) CAPS Take by mouth.    [provider]  fexofenadine (ALLEGRA) 180 MG tablet Take 180 mg by mouth daily.    [provider]  fluticasone (FLONASE) 50 MCG/ACT nasal spray Place 2 sprays into both nostrils daily. 07/03/18   Valentina Shaggy, MD  meloxicam (MOBIC) 15 MG tablet Take by mouth. 10/05/17   [provider]  rOPINIRole (REQUIP) 3 MG tablet Take 3 mg by mouth at bedtime. 05/03/18   [provider]    Family History Family History  Problem Relation Age of Onset   Allergic rhinitis Mother    Emphysema Paternal Grandfather  Angioedema Neg Hx    Asthma Neg Hx    Eczema Neg Hx    Immunodeficiency Neg Hx    Urticaria Neg Hx     Social History Social History   Tobacco Use   Smoking status: Former Smoker    Packs/day: 1.00    Years: 10.00    Pack years: 10.00    Types: Cigarettes    Quit date: 08/09/1978    Years since quitting: 40.2   Smokeless tobacco: Never Used  Substance Use Topics   Alcohol use: Yes    Alcohol/week: 1.0 standard drinks    Types: 1 Glasses of wine per week    Comment: occasional during business meeting dinners   Drug use: Never     Allergies   Patient has no known allergies.   Review of  Systems Review of Systems  Constitutional: Positive for chills and fever.  HENT: Negative.   Eyes: Negative for visual disturbance.  Respiratory: Negative for cough and shortness of breath.   Cardiovascular: Negative for chest pain.  Gastrointestinal: Positive for abdominal pain, nausea and vomiting. Negative for constipation and diarrhea.  Genitourinary: Negative for dysuria, flank pain, frequency and hematuria.  Musculoskeletal: Negative for arthralgias and myalgias.  Skin: Negative for color change and rash.  Neurological: Negative for dizziness, syncope, light-headedness and headaches.     Physical Exam Updated Vital Signs BP (!) 157/75    Pulse (!) 110    Temp 100.3 F (37.9 C) (Oral)    Resp 20    Ht 5\' 8"  (1.727 m)    Wt 92.1 kg    SpO2 96%    BMI 30.87 kg/m   Physical Exam Vitals signs and nursing note reviewed.  Constitutional:      General: He is not in acute distress.    Appearance: Normal appearance. He is well-developed and normal weight. He is not ill-appearing or diaphoretic.  HENT:     Head: Normocephalic and atraumatic.     Nose: Nose normal.     Mouth/Throat:     Mouth: Mucous membranes are moist.     Pharynx: Oropharynx is clear.  Eyes:     General:        Right eye: No discharge.        Left eye: No discharge.     Pupils: Pupils are equal, round, and reactive to light.  Neck:     Musculoskeletal: Neck supple.  Cardiovascular:     Rate and Rhythm: Normal rate and regular rhythm.     Heart sounds: Normal heart sounds. No murmur. No friction rub. No gallop.   Pulmonary:     Effort: Pulmonary effort is normal. No respiratory distress.     Breath sounds: Normal breath sounds. No wheezing or rales.     Comments: Respirations equal and unlabored, patient able to speak in full sentences, lungs clear to auscultation bilaterally Abdominal:     General: Bowel sounds are normal. There is no distension.     Palpations: Abdomen is soft. There is no mass.      Tenderness: There is no abdominal tenderness. There is no guarding.     Comments: Abdomen soft, nondistended, nontender to palpation in all quadrants without guarding or peritoneal signs, no CVA tenderness bilaterally  Musculoskeletal:        General: No deformity.     Right lower leg: No edema.     Left lower leg: No edema.  Skin:    General: Skin is warm and dry.  Capillary Refill: Capillary refill takes less than 2 seconds.     Findings: No rash.  Neurological:     Mental Status: He is alert and oriented to person, place, and time.     Coordination: Coordination normal.     Comments: Speech is clear, able to follow commands Moves extremities without ataxia, coordination intact  Psychiatric:        Mood and Affect: Mood normal.        Behavior: Behavior normal.      ED Treatments / Results  Labs (all labs ordered are listed, but only abnormal results are displayed) Labs Reviewed  CBC WITH DIFFERENTIAL/PLATELET - Abnormal; Notable for the following components:      Result Value   Neutro Abs 8.2 (*)    Lymphs Abs 0.5 (*)    All other components within normal limits  LIPASE, BLOOD - Abnormal; Notable for the following components:   Lipase 177 (*)    All other components within normal limits  COMPREHENSIVE METABOLIC PANEL - Abnormal; Notable for the following components:   Sodium 134 (*)    Potassium 3.4 (*)    CO2 21 (*)    Glucose, Bld 114 (*)    Calcium 8.7 (*)    AST 75 (*)    ALT 131 (*)    Total Bilirubin 1.4 (*)    GFR calc non Af Amer 60 (*)    All other components within normal limits  URINALYSIS, ROUTINE W REFLEX MICROSCOPIC - Abnormal; Notable for the following components:   Hgb urine dipstick MODERATE (*)    Ketones, ur 20 (*)    All other components within normal limits  CBC - Abnormal; Notable for the following components:   WBC 10.6 (*)    All other components within normal limits  SARS CORONAVIRUS 2  URINE CULTURE  CULTURE, BLOOD (ROUTINE X 2)    CULTURE, BLOOD (ROUTINE X 2)  SARS CORONAVIRUS 2 (HOSPITAL ORDER, [facility])  LACTIC ACID, PLASMA  COMPREHENSIVE METABOLIC PANEL  LIPASE, BLOOD    EKG EKG Interpretation  Date/Time:  Saturday October 18 2018 17:49:13 EDT Ventricular Rate:  116 PR Interval:    QRS Duration: 99 QT Interval:  317 QTC Calculation: 441 R Axis:   -10 Text Interpretation:  Sinus tachycardia Borderline T abnormalities, diffuse leads Confirmed by Pryor Curia [phone]) on 10/19/2018 12:19:02 PM   Radiology Ct Abdomen Pelvis W Contrast  Result Date: 10/18/2018 CLINICAL DATA:  Severe abdominal pain and nausea yesterday, fever today, suspected diverticulitis EXAM: CT ABDOMEN AND PELVIS WITH CONTRAST TECHNIQUE: Multidetector CT imaging of the abdomen and pelvis was performed using the standard protocol following bolus administration of intravenous contrast. Sagittal and coronal MPR images reconstructed from axial data set. CONTRAST:  125mL OMNIPAQUE IOHEXOL 300 MG/ML SOLN IV. No oral contrast. 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 Lung bases clear. Hepatobiliary: Small hepatic cysts. No other hepatic mass lesions or biliary dilatation. Markedly thickened gallbladder wall and mild surrounding pericholecystic edema highly suspicious for acute cholecystitis. Pancreas: Normal appearance Spleen: Normal appearance Adrenals/Urinary Tract: Adrenal glands normal appearance. BILATERAL nonobstructing renal calculi. Multiple BILATERAL peripelvic renal cysts. No hydronephrosis or hydroureter. Bladder unremarkable. Stomach/Bowel: Normal appendix. Scattered colonic diverticulosis of descending and sigmoid colon without evidence of diverticulitis. Stomach and remaining bowel loops unremarkable. Vascular/Lymphatic: Atherosclerotic calcifications aorta without aneurysm. No adenopathy. Reproductive: Unremarkable  prostate gland and seminal vesicles Other: Small umbilical hernia containing  fat. No free air or free fluid. Musculoskeletal: Degenerative disc disease changes lumbar spine. IMPRESSION: Distal colonic diverticulosis without evidence of diverticulitis. Markedly thickened gallbladder wall with mild pericholecystic edema highly suspicious for acute cholecystitis; recommend ultrasound assessment. BILATERAL peripelvic renal cysts and nonobstructing renal calculi. Small BILATERAL posterior Bochdalek's type diaphragmatic hernias containing fat. Small umbilical hernia containing fat. Electronically Signed   By: Lavonia Dana M.D.   On: 10/18/2018 19:25   Dg Chest Port 1 View  Result Date: 10/18/2018 CLINICAL DATA:  Pt reports stomach ache and fever of 102.5 yesterday. Went to UC and was sent here due to temp of 102.2, HR 120, oxygen saturation 89%. Denies cough or SOB. Pt is a former smoker EXAM: PORTABLE CHEST 1 VIEW COMPARISON:  05/09/2018 FINDINGS: Cardiac silhouette is normal in size. No mediastinal or hilar masses or evidence of adenopathy. Mild opacity noted at both lung bases, most likely atelectasis. Cannot exclude infection. Remainder of the lungs is clear. No convincing pleural effusion. No pneumothorax. Skeletal structures are grossly intact. IMPRESSION: 1. Mild lung base opacity, most likely due to atelectasis. Pneumonia is possible but felt less likely. No other evidence of acute cardiopulmonary disease. Electronically Signed   By: Lajean Manes M.D.   On: 10/18/2018 18:36   US Abdomen Limited Ruq  Result Date: 10/18/2018 CLINICAL DATA:  Abnormal gallbladder seen on recent CT EXAM: ULTRASOUND ABDOMEN LIMITED RIGHT UPPER QUADRANT COMPARISON:  CT from the same day FINDINGS: Gallbladder: There is diffuse gallbladder wall thickening with the gallbladder wall measuring approximately 7 mm in thickness. The sonographic Percell Miller sign is negative. There is pericholecystic free fluid and gallbladder sludge. Common  bile duct: Diameter: 0.2 cm. The majority of the common bile duct was poorly evaluated on this exam secondary to overlying bowel gas and suboptimal sonographic windows. Liver: No focal lesion identified. Within normal limits in parenchymal echogenicity. Portal vein is patent on color Doppler imaging with normal direction of blood flow towards the liver. Multiple right renal peripelvic cysts are noted. IMPRESSION: Gallbladder sludge with pericholecystic free fluid and gallbladder wall thickening. However, the sonographic Percell Miller sign is reported as negative. Overall findings are equivocal for acute cholecystitis. If there is clinical suspicion for acute cholecystitis follow-up with HIDA scan is recommended. Consider surgical consultation. Electronically Signed   By: Constance Holster M.D.   On: 10/18/2018 20:37    Procedures Procedures (including critical care time)  Medications Ordered in ED Medications  ondansetron (ZOFRAN) injection 4 mg (0 mg Intravenous Hold 10/18/18 1936)  sodium chloride 0.9 % bolus 1,000 mL (1,000 mLs Intravenous New Bag/Given 10/18/18 1934)  iohexol (OMNIPAQUE) 300 MG/ML solution 100 mL (100 mLs Intravenous Contrast Given 10/18/18 1915)  cefTRIAXone (ROCEPHIN) 2 g in sodium chloride 0.9 % 100 mL IVPB (0 g Intravenous Stopped 10/18/18 2222)  acetaminophen (TYLENOL) tablet 650 mg (650 mg Oral Given 10/18/18 2127)     Initial Impression / Assessment and Plan / ED Course  I have reviewed the triage vital signs and the nursing notes.  Pertinent labs & imaging results that were available during my care of the patient were reviewed by me and considered in my medical decision making (see chart for details).  Patient presents to the emergency department for evaluation of fever, nausea, yesterday had abdominal pain which is improved today.  Initially presented to urgent care and was referred to the ED given fever tachycardia.  Patient had O2 sat of 89% reported at urgent care but  oxygenation has been normal here in  the ED.  Patient denies shortness of breath or cough, no chest pain.  Yesterday after eating he had generalized severe abdominal cramping and nausea without vomiting.  Today abdominal exam is benign but with fevers and persistent nausea concern for possible abdominal etiology for patient's symptoms.  Despite fever and tachycardia patient is well-appearing and in no acute distress with stable blood pressure.  Will get abdominal labs, lactic acid, blood cultures, EKG, chest x-ray, urinalysis and culture, and CT abdomen pelvis.  We will also send coronavirus test.  Patient is currently pain-free but will give IV fluids and Zofran for nausea.  6:27 PM updated patient's son Ivann Trimarco via phone, will give another update once more results are back.  Labs show no leukocytosis, patient with elevation of AST and ALT, and T bili of 1.4 lipase is elevated at well is 177 concerning for possible biliary etiology of patient's symptoms.  No other acute electrolyte derangements.  Normal renal function.  Urinalysis without signs of infection, culture collected.  Lactic acid is not elevated.  Patient's coronavirus test is negative.  Chest x-ray shows some slight atelectasis less likely pneumonia, no other acute findings.  CT of the abdomen pelvis significant for a markedly thickened gallbladder wall with some pericholecystic fluid and edema concerning for acute cholecystitis, there is evidence of diverticulosis but no diverticulitis and incidental finding of bilateral renal cysts, no other acute findings.  Right upper quadrant ultrasound recommended to better evaluate gallbladder and liver.  Will also discuss with general surgery.  Case discussed with Dr. Georgette Dover with general surgery, he expresses concern over fever given no prior history of gallbladder issues, and plans to follow-up on gallbladder ultrasound, but the rest of patient's work-up has been reassuring, coronavirus test  negative, no respiratory symptoms and no urinary tract infection and no other acute findings on CT.  Patient given IV Rocephin for acute cholecystitis and Dr. Georgette Dover will admit to surgery service for likely cholecystectomy in the morning.  Again called and updated patient's son via phone on plan for admission and likely surgery in the morning.  Final Clinical Impressions(s) / ED Diagnoses   Final diagnoses:  Abnormal CT scan, gallbladder  Cholecystitis, acute  Fever, unspecified fever cause    ED Discharge Orders    None       Jacqlyn Larsen, [place] 10/19/18 1426    Charlesetta Shanks, MD 10/19/18 1534

## 2018-10-18 NOTE — H&P (Signed)
Joe Gonzales is an 73 y.o. male.   Chief Complaint: Nausea, RUQ pain HPI: This is a 73 year old male in excellent health who presents with a one-day history of fever, nausea, abdominal pain.  The patient ate some chicken last night, then developed severe nausea and fever, as well as abdominal pain.  No diarrhea.  Fever up to 102.6.  Pain is significantly improved today.  Still febrile to 101.    COVID negative  Past Medical History:  Diagnosis Date  . Dyspnea   . Rhinitis     Past Surgical History:  Procedure Laterality Date  . ADENOIDECTOMY    . EYE SURGERY    . TONSILLECTOMY      Family History  Problem Relation Age of Onset  . Allergic rhinitis Mother   . Emphysema Paternal Grandfather   . Angioedema Neg Hx   . Asthma Neg Hx   . Eczema Neg Hx   . Immunodeficiency Neg Hx   . Urticaria Neg Hx    Social History:  reports that he quit smoking about 40 years ago. His smoking use included cigarettes. He has a 10.00 pack-year smoking history. He has never used smokeless tobacco. He reports current alcohol use of about 1.0 standard drinks of alcohol per week. He reports that he does not use drugs.  Allergies: No Known Allergies  Prior to Admission medications   Medication Sig Start Date End Date Taking? Authorizing Provider  albuterol (PROAIR HFA) 108 (90 Base) MCG/ACT inhaler Inhale 1-2 puffs into the lungs every 4 (four) hours as needed for wheezing or shortness of breath. 07/03/18   Valentina Shaggy, MD  azelastine (ASTELIN) 0.1 % nasal spray Place 2 sprays into both nostrils 2 (two) times daily. 07/09/18   Dara Hoyer, FNP  budesonide-formoterol (SYMBICORT) 160-4.5 MCG/ACT inhaler Inhale 2 puffs into the lungs 2 (two) times daily. 07/03/18   Valentina Shaggy, MD  Cholecalciferol (VITAMIN D) 50 MCG (2000 UT) CAPS Take by mouth.    [provider]  fexofenadine (ALLEGRA) 180 MG tablet Take 180 mg by mouth daily.    [provider]  fluticasone  (FLONASE) 50 MCG/ACT nasal spray Place 2 sprays into both nostrils daily. 07/03/18   Valentina Shaggy, MD  meloxicam (MOBIC) 15 MG tablet Take by mouth. 10/05/17   [provider]  rOPINIRole (REQUIP) 3 MG tablet Take 3 mg by mouth at bedtime. 05/03/18   [provider]     Results for orders placed or performed during the hospital encounter of 10/18/18 (from the past 48 hour(s))  Lactic acid, plasma     Status: None   Collection Time: 10/18/18  5:54 PM  Result Value Ref Range   Lactic Acid, Venous 1.5 0.5 - 1.9 mmol/L    Comment: Performed at [facility], [place] [address], [place], [place] [address]  CBC with Differential     Status: Abnormal   Collection Time: 10/18/18  5:59 PM  Result Value Ref Range   WBC 9.3 4.0 - 10.5 K/uL   RBC 4.92 4.22 - 5.81 MIL/uL   Hemoglobin 15.2 13.0 - 17.0 g/dL   HCT 44.4 39.0 - 52.0 %   MCV 90.2 80.0 - 100.0 fL   MCH 30.9 26.0 - 34.0 pg   MCHC 34.2 30.0 - 36.0 g/dL   RDW 13.1 11.5 - 15.5 %   Platelets 225 150 - 400 K/uL   nRBC 0.0 0.0 - 0.2 %   Neutrophils Relative %  89 %   Neutro Abs 8.2 (H) 1.7 - 7.7 K/uL   Lymphocytes Relative 5 %   Lymphs Abs 0.5 (L) 0.7 - 4.0 K/uL   Monocytes Relative 6 %   Monocytes Absolute 0.6 0.1 - 1.0 K/uL   Eosinophils Relative 0 %   Eosinophils Absolute 0.0 0.0 - 0.5 K/uL   Basophils Relative 0 %   Basophils Absolute 0.0 0.0 - 0.1 K/uL   Immature Granulocytes 0 %   Abs Immature Granulocytes 0.04 0.00 - 0.07 K/uL    Comment: Performed at [facility] [address], [place], [place] [address]  Lipase, blood     Status: Abnormal   Collection Time: 10/18/18  5:59 PM  Result Value Ref Range   Lipase 177 (H) 11 - 51 U/L    Comment: Performed at [facility], [place] [address] [place], [place] [address]  Comprehensive metabolic panel     Status: Abnormal   Collection Time: 10/18/18  5:59 PM  Result Value Ref Range   Sodium 134 (L) 135 - 145 mmol/L   Potassium 3.4 (L) 3.5 - 5.1  mmol/L   Chloride 103 98 - 111 mmol/L   CO2 21 (L) 22 - 32 mmol/L   Glucose, Bld 114 (H) 70 - 99 mg/dL   BUN 13 8 - 23 mg/dL   Creatinine, Ser 1.21 0.61 - 1.24 mg/dL   Calcium 8.7 (L) 8.9 - 10.3 mg/dL   Total Protein 6.6 6.5 - 8.1 g/dL   Albumin 3.7 3.5 - 5.0 g/dL   AST 75 (H) 15 - 41 U/L   ALT 131 (H) 0 - 44 U/L   Alkaline Phosphatase 78 38 - 126 U/L   Total Bilirubin 1.4 (H) 0.3 - 1.2 mg/dL   GFR calc non Af Amer 60 (L) >60 mL/min   GFR calc Af Amer >60 >60 mL/min   Anion gap 10 5 - 15    Comment: Performed at [facility], [place] [address], [place], [place] [address]  SARS Coronavirus 2     Status: None   Collection Time: 10/18/18  6:00 PM  Result Value Ref Range   SARS Coronavirus 2 NOT DETECTED NOT DETECTED    Comment: (NOTE) SARS-CoV-2 target nucleic acids are NOT DETECTED. The SARS-CoV-2 RNA is generally detectable in upper and lower respiratory specimens during the acute phase of infection.  Negative  results do not preclude SARS-CoV-2 infection, do not rule out co-infections with other pathogens, and should not be used as the sole basis for treatment or other patient management decisions.  Negative results must be combined with clinical observations, patient history, and epidemiological information. The expected result is Not Detected. Fact Sheet for Patients: http://www.biofiredefense.com/wp-content/uploads/2020/03/BIOFIRE-COVID -19-patients.pdf Fact Sheet for Healthcare Providers: http://www.biofiredefense.com/wp-content/uploads/2020/03/BIOFIRE-COVID -19-hcp.pdf This test is not yet approved or cleared by the [place] and  has been authorized for detection and/or diagnosis of SARS-CoV-2 by FDA under an Emergency Use Authorization (EUA).  This EUA will remain in effec t (meaning this test can be used) for the duration of  the COVID-19 declaration under Section 564(b)(1) of the Act, 21 U.S.C. section 360bbb-3(b)(1), unless the authorization  is terminated or revoked sooner. Performed at [facility], [place] [address] [place], [place] [address]   Urinalysis, Routine w reflex microscopic     Status: Abnormal   Collection Time: 10/18/18  7:53 PM  Result Value Ref Range   Color, Urine YELLOW YELLOW   APPearance CLEAR CLEAR  Specific Gravity, Urine 1.026 1.005 - 1.030   pH 5.0 5.0 - 8.0   Glucose, UA NEGATIVE NEGATIVE mg/dL   Hgb urine dipstick MODERATE (A) NEGATIVE   Bilirubin Urine NEGATIVE NEGATIVE   Ketones, ur 20 (A) NEGATIVE mg/dL   Protein, ur NEGATIVE NEGATIVE mg/dL   Nitrite NEGATIVE NEGATIVE   Leukocytes,Ua NEGATIVE NEGATIVE   RBC / HPF 0-5 0 - 5 RBC/hpf   WBC, UA 0-5 0 - 5 WBC/hpf   Bacteria, UA NONE SEEN NONE SEEN    Comment: Performed at [facility] [address] [place], [place] [address]   Ct Abdomen Pelvis W Contrast  Result Date: 10/18/2018 CLINICAL DATA:  Severe abdominal pain and nausea yesterday, fever today, suspected diverticulitis EXAM: CT ABDOMEN AND PELVIS WITH CONTRAST TECHNIQUE: Multidetector CT imaging of the abdomen and pelvis was performed using the standard protocol following bolus administration of intravenous contrast. Sagittal and coronal MPR images reconstructed from axial data set. CONTRAST:  150mL OMNIPAQUE IOHEXOL 300 MG/ML SOLN IV. No oral contrast. 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 Lung bases clear. Hepatobiliary: Small hepatic cysts. No other hepatic mass lesions or biliary dilatation. Markedly thickened gallbladder wall and mild surrounding pericholecystic edema highly suspicious for acute cholecystitis. Pancreas: Normal appearance Spleen: Normal appearance Adrenals/Urinary Tract: Adrenal glands normal appearance. BILATERAL nonobstructing renal calculi. Multiple BILATERAL peripelvic renal cysts. No hydronephrosis or hydroureter. Bladder unremarkable. Stomach/Bowel: Normal  appendix. Scattered colonic diverticulosis of descending and sigmoid colon without evidence of diverticulitis. Stomach and remaining bowel loops unremarkable. Vascular/Lymphatic: Atherosclerotic calcifications aorta without aneurysm. No adenopathy. Reproductive: Unremarkable prostate gland and seminal vesicles Other: Small umbilical hernia containing fat. No free air or free fluid. Musculoskeletal: Degenerative disc disease changes lumbar spine. IMPRESSION: Distal colonic diverticulosis without evidence of diverticulitis. Markedly thickened gallbladder wall with mild pericholecystic edema highly suspicious for acute cholecystitis; recommend ultrasound assessment. BILATERAL peripelvic renal cysts and nonobstructing renal calculi. Small BILATERAL posterior Bochdalek's type diaphragmatic hernias containing fat. Small umbilical hernia containing fat. Electronically Signed   By: Lavonia Dana M.D.   On: 10/18/2018 19:25   Dg Chest Port 1 View  Result Date: 10/18/2018 CLINICAL DATA:  Pt reports stomach ache and fever of 102.5 yesterday. Went to UC and was sent here due to temp of 102.2, HR 120, oxygen saturation 89%. Denies cough or SOB. Pt is a former smoker EXAM: PORTABLE CHEST 1 VIEW COMPARISON:  05/09/2018 FINDINGS: Cardiac silhouette is normal in size. No mediastinal or hilar masses or evidence of adenopathy. Mild opacity noted at both lung bases, most likely atelectasis. Cannot exclude infection. Remainder of the lungs is clear. No convincing pleural effusion. No pneumothorax. Skeletal structures are grossly intact. IMPRESSION: 1. Mild lung base opacity, most likely due to atelectasis. Pneumonia is possible but felt less likely. No other evidence of acute cardiopulmonary disease. Electronically Signed   By: Lajean Manes M.D.   On: 10/18/2018 18:36    Review of Systems  Constitutional: Positive for fever. Negative for weight loss.  HENT: Negative for ear discharge, ear pain, hearing loss and tinnitus.    Eyes: Negative.  Negative for blurred vision, double vision, photophobia and pain.  Respiratory: Negative for cough, sputum production and shortness of breath.   Cardiovascular: Negative for chest pain.  Gastrointestinal: Positive for abdominal pain, nausea and vomiting.  Genitourinary: Negative for dysuria, flank pain, frequency and urgency.  Musculoskeletal: Negative for back pain, falls, joint pain, myalgias and neck pain.  Neurological: Negative.  Negative for dizziness, tingling, sensory change, focal weakness, loss of consciousness and headaches.  Endo/Heme/Allergies: Negative.  Does not bruise/bleed easily.  Psychiatric/Behavioral: Negative for depression, memory loss and substance abuse. The patient is not nervous/anxious.     Blood pressure 138/71, pulse (!) 107, temperature (!) 101.6 F (38.7 C), temperature source Oral, resp. rate 13, height 5\' 8"  (1.727 m), weight 92.1 kg, SpO2 94 %. Physical Exam  WDWN in NAD Eyes:  Pupils equal, round; sclera anicteric HENT:  Oral mucosa moist; good dentition  Neck:  No masses palpated, no thyromegaly Lungs:  CTA bilaterally; normal respiratory effort CV:  Regular rate and rhythm; no murmurs; extremities well-perfused with no edema Abd:  +bowel sounds, soft, mildly tender in RUQ, no palpable organomegaly; no palpable hernias Skin:  Warm, dry; no sign of jaundice Psychiatric - alert and oriented x 4; calm mood and affect  Assessment/Plan Probable acute cholecystitis based on CT findings, labs, and physical examination [place] will confirm findings Probable passed CBD stone - mild pancreatitis, mild elevated T. BIli, elevated AST/ ALT  Will admit for IV antibiotics, probable laparoscopic cholecystectomy in AM.  Maia Petties, MD 10/18/2018, 8:20 PM

## 2018-10-18 NOTE — Progress Notes (Signed)
Patient arrived to [facility] from ED. Patient alert and oriented x4. No complaint of pain at moment. Surgery scheduled for am. Will keep family updated.

## 2018-10-18 NOTE — ED Triage Notes (Signed)
Pt reports stomach ache and fever of 102.5 yesterday. Went to UC and was sent here due to T 102.2, HR 120, oxygen saturation 89%. Denies cough or SOB.

## 2018-10-18 NOTE — ED Notes (Signed)
Pt returned from CT °

## 2018-10-19 ENCOUNTER — Encounter (HOSPITAL_COMMUNITY): Payer: Self-pay | Admitting: Surgery

## 2018-10-19 ENCOUNTER — Observation Stay (HOSPITAL_COMMUNITY): Payer: Medicare HMO | Admitting: Certified Registered Nurse Anesthetist

## 2018-10-19 ENCOUNTER — Encounter (HOSPITAL_COMMUNITY)
Admission: EM | Disposition: A | Discharge status: Home / Self Care | Payer: Self-pay | Source: Home / Self Care | Attending: Emergency Medicine

## 2018-10-19 DIAGNOSIS — M1990 Unspecified osteoarthritis, unspecified site: Secondary | ICD-10-CM | POA: Diagnosis not present

## 2018-10-19 DIAGNOSIS — K812 Acute cholecystitis with chronic cholecystitis: Secondary | ICD-10-CM | POA: Diagnosis not present

## 2018-10-19 DIAGNOSIS — J45909 Unspecified asthma, uncomplicated: Secondary | ICD-10-CM | POA: Diagnosis not present

## 2018-10-19 DIAGNOSIS — Z1159 Encounter for screening for other viral diseases: Secondary | ICD-10-CM | POA: Diagnosis not present

## 2018-10-19 HISTORY — PX: CHOLECYSTECTOMY: SHX55

## 2018-10-19 LAB — BLOOD CULTURE ID PANEL (REFLEXED)

## 2018-10-19 LAB — LIPASE, BLOOD: Lipase: 89 U/L — ABNORMAL HIGH (ref 11–51)

## 2018-10-19 LAB — COMPREHENSIVE METABOLIC PANEL
ALT: 93 U/L — ABNORMAL HIGH (ref 0–44)
AST: 45 U/L — ABNORMAL HIGH (ref 15–41)
Albumin: 2.9 g/dL — ABNORMAL LOW (ref 3.5–5.0)
Alkaline Phosphatase: 61 U/L (ref 38–126)
Anion gap: 8 (ref 5–15)
BUN: 10 mg/dL (ref 8–23)
CO2: 24 mmol/L (ref 22–32)
Calcium: 7.7 mg/dL — ABNORMAL LOW (ref 8.9–10.3)
Chloride: 104 mmol/L (ref 98–111)
Creatinine, Ser: 1.23 mg/dL (ref 0.61–1.24)
GFR calc Af Amer: >60 mL/min (ref >60)
GFR calc non Af Amer: 59 mL/min — ABNORMAL LOW (ref >60)
Glucose, Bld: 115 mg/dL — ABNORMAL HIGH (ref 70–99)
Potassium: 3.2 mmol/L — ABNORMAL LOW (ref 3.5–5.1)
Sodium: 136 mmol/L (ref 135–145)
Total Bilirubin: 0.9 mg/dL (ref 0.3–1.2)
Total Protein: 5.5 g/dL — ABNORMAL LOW (ref 6.5–8.1)

## 2018-10-19 LAB — SURGICAL PCR SCREEN
MRSA, PCR: NEGATIVE
Staphylococcus aureus: NEGATIVE

## 2018-10-19 SURGERY — LAPAROSCOPIC CHOLECYSTECTOMY WITH INTRAOPERATIVE CHOLANGIOGRAM
Anesthesia: General | Site: Abdomen

## 2018-10-19 MED ORDER — FENTANYL CITRATE (PF) 250 MCG/5ML IJ SOLN
INTRAMUSCULAR | Status: AC
  Filled 2018-10-19: qty 5

## 2018-10-19 MED ORDER — LIDOCAINE 2% (20 MG/ML) 5 ML SYRINGE
INTRAMUSCULAR | Status: DC | PRN
  Administered 2018-10-19: 60 mg via INTRAVENOUS

## 2018-10-19 MED ORDER — MEPERIDINE HCL 25 MG/ML IJ SOLN
INTRAMUSCULAR | Status: DC | PRN
  Administered 2018-10-19: 25 mg via INTRAVENOUS

## 2018-10-19 MED ORDER — PROPOFOL 10 MG/ML IV BOLUS
INTRAVENOUS | Status: DC | PRN
  Administered 2018-10-19: 130 mg via INTRAVENOUS

## 2018-10-19 MED ORDER — PROPOFOL 10 MG/ML IV BOLUS
INTRAVENOUS | Status: AC
  Filled 2018-10-19: qty 20

## 2018-10-19 MED ORDER — LIDOCAINE 2% (20 MG/ML) 5 ML SYRINGE
INTRAMUSCULAR | Status: AC
  Filled 2018-10-19: qty 5

## 2018-10-19 MED ORDER — MIDAZOLAM HCL 2 MG/2ML IJ SOLN
INTRAMUSCULAR | Status: AC
  Filled 2018-10-19: qty 2

## 2018-10-19 MED ORDER — DEXAMETHASONE SODIUM PHOSPHATE 10 MG/ML IJ SOLN
INTRAMUSCULAR | Status: AC
  Filled 2018-10-19: qty 1

## 2018-10-19 MED ORDER — FENTANYL CITRATE (PF) 250 MCG/5ML IJ SOLN
INTRAMUSCULAR | Status: DC | PRN
  Administered 2018-10-19: 100 ug via INTRAVENOUS
  Administered 2018-10-19 (×3): 50 ug via INTRAVENOUS

## 2018-10-19 MED ORDER — OXYCODONE HCL 5 MG PO TABS: 5.0000 mg | ORAL_TABLET | ORAL | Status: DC | PRN

## 2018-10-19 MED ORDER — MEPERIDINE HCL 25 MG/ML IJ SOLN
INTRAMUSCULAR | Status: AC
  Filled 2018-10-19: qty 1

## 2018-10-19 MED ORDER — 0.9 % SODIUM CHLORIDE (POUR BTL) OPTIME
TOPICAL | Status: DC | PRN
  Administered 2018-10-19: 11:00:00 1000 mL

## 2018-10-19 MED ORDER — ROCURONIUM BROMIDE 10 MG/ML (PF) SYRINGE
PREFILLED_SYRINGE | INTRAVENOUS | Status: DC | PRN
  Administered 2018-10-19: 60 mg via INTRAVENOUS

## 2018-10-19 MED ORDER — SUGAMMADEX SODIUM 200 MG/2ML IV SOLN
INTRAVENOUS | Status: DC | PRN
  Administered 2018-10-19: 200 mg via INTRAVENOUS

## 2018-10-19 MED ORDER — LACTATED RINGERS IV SOLN
INTRAVENOUS | Status: DC | PRN
  Administered 2018-10-19: 11:00:00 via INTRAVENOUS

## 2018-10-19 MED ORDER — BUPIVACAINE-EPINEPHRINE (PF) 0.25% -1:200000 IJ SOLN
INTRAMUSCULAR | Status: AC
  Filled 2018-10-19: qty 30

## 2018-10-19 MED ORDER — SUCCINYLCHOLINE CHLORIDE 200 MG/10ML IV SOSY
PREFILLED_SYRINGE | INTRAVENOUS | Status: AC
  Filled 2018-10-19: qty 10

## 2018-10-19 MED ORDER — DEXAMETHASONE SODIUM PHOSPHATE 10 MG/ML IJ SOLN
INTRAMUSCULAR | Status: DC | PRN
  Administered 2018-10-19: 10 mg via INTRAVENOUS

## 2018-10-19 MED ORDER — PHENYLEPHRINE 40 MCG/ML (10ML) SYRINGE FOR IV PUSH (FOR BLOOD PRESSURE SUPPORT)
PREFILLED_SYRINGE | INTRAVENOUS | Status: DC | PRN
  Administered 2018-10-19: 80 ug via INTRAVENOUS

## 2018-10-19 MED ORDER — BUPIVACAINE-EPINEPHRINE 0.25% -1:200000 IJ SOLN
INTRAMUSCULAR | Status: DC | PRN
  Administered 2018-10-19: 10 mL

## 2018-10-19 MED ORDER — SUCCINYLCHOLINE CHLORIDE 20 MG/ML IJ SOLN
INTRAMUSCULAR | Status: DC | PRN
  Administered 2018-10-19: 100 mg via INTRAVENOUS

## 2018-10-19 MED ORDER — SODIUM CHLORIDE 0.9 % IR SOLN
Status: DC | PRN
  Administered 2018-10-19: 1000 mL

## 2018-10-19 MED ORDER — HYDROMORPHONE HCL 1 MG/ML IJ SOLN: 0.2500 mg | INTRAMUSCULAR | Status: DC | PRN

## 2018-10-19 MED ORDER — ONDANSETRON HCL 4 MG/2ML IJ SOLN
INTRAMUSCULAR | Status: AC
  Filled 2018-10-19: qty 2

## 2018-10-19 MED ORDER — PHENYLEPHRINE 40 MCG/ML (10ML) SYRINGE FOR IV PUSH (FOR BLOOD PRESSURE SUPPORT)
PREFILLED_SYRINGE | INTRAVENOUS | Status: AC
  Filled 2018-10-19: qty 10

## 2018-10-19 SURGICAL SUPPLY — 44 items
APPLIER CLIP 5 13 M/L LIGAMAX5 (MISCELLANEOUS) ×2
BLADE CLIPPER SURG (BLADE) IMPLANT
CANISTER SUCT 3000ML PPV (MISCELLANEOUS) ×2 IMPLANT
CHLORAPREP W/TINT 26ML (MISCELLANEOUS) ×2 IMPLANT
CLIP APPLIE 5 13 M/L LIGAMAX5 (MISCELLANEOUS) ×1 IMPLANT
COVER MAYO STAND STRL (DRAPES) ×2 IMPLANT
COVER SURGICAL LIGHT HANDLE (MISCELLANEOUS) ×2 IMPLANT
COVER WAND RF STERILE (DRAPES) ×2 IMPLANT
DERMABOND ADVANCED (GAUZE/BANDAGES/DRESSINGS) ×1
DERMABOND ADVANCED .7 DNX12 (GAUZE/BANDAGES/DRESSINGS) ×1 IMPLANT
DRAPE C-ARM 42X72 X-RAY (DRAPES) ×2 IMPLANT
ELECT REM PT RETURN 9FT ADLT (ELECTROSURGICAL) ×2
ELECTRODE REM PT RTRN 9FT ADLT (ELECTROSURGICAL) ×1 IMPLANT
FILTER SMOKE EVAC LAPAROSHD (FILTER) IMPLANT
GLOVE BIO SURGEON STRL SZ8 (GLOVE) ×2 IMPLANT
GLOVE BIOGEL PI IND STRL 8 (GLOVE) ×1 IMPLANT
GLOVE BIOGEL PI INDICATOR 8 (GLOVE) ×1
GOWN STRL REUS W/ TWL LRG LVL3 (GOWN DISPOSABLE) ×2 IMPLANT
GOWN STRL REUS W/ TWL XL LVL3 (GOWN DISPOSABLE) ×1 IMPLANT
GOWN STRL REUS W/TWL LRG LVL3 (GOWN DISPOSABLE) ×2
GOWN STRL REUS W/TWL XL LVL3 (GOWN DISPOSABLE) ×1
KIT BASIN OR (CUSTOM PROCEDURE TRAY) ×2 IMPLANT
KIT TURNOVER KIT B (KITS) ×2 IMPLANT
L-HOOK LAP DISP 36CM (ELECTROSURGICAL) ×2
LHOOK LAP DISP 36CM (ELECTROSURGICAL) ×1 IMPLANT
NEEDLE 22X1 1/2 (OR ONLY) (NEEDLE) ×2 IMPLANT
NS IRRIG 1000ML POUR BTL (IV SOLUTION) ×2 IMPLANT
PAD ARMBOARD 7.5X6 YLW CONV (MISCELLANEOUS) ×2 IMPLANT
PENCIL BUTTON HOLSTER BLD 10FT (ELECTRODE) ×2 IMPLANT
POUCH RETRIEVAL ECOSAC 10 (ENDOMECHANICALS) ×1 IMPLANT
POUCH RETRIEVAL ECOSAC 10MM (ENDOMECHANICALS) ×1
SCISSORS LAP 5X35 DISP (ENDOMECHANICALS) ×2 IMPLANT
SET CHOLANGIOGRAPH 5 50 .035 (SET/KITS/TRAYS/PACK) ×2 IMPLANT
SET IRRIG TUBING LAPAROSCOPIC (IRRIGATION / IRRIGATOR) ×2 IMPLANT
SET TUBE SMOKE EVAC HIGH FLOW (TUBING) ×2 IMPLANT
SLEEVE ENDOPATH XCEL 5M (ENDOMECHANICALS) ×4 IMPLANT
SPECIMEN JAR SMALL (MISCELLANEOUS) ×2 IMPLANT
SUT VIC AB 4-0 PS2 27 (SUTURE) ×2 IMPLANT
TOWEL OR 17X24 6PK STRL BLUE (TOWEL DISPOSABLE) ×2 IMPLANT
TOWEL OR 17X26 10 PK STRL BLUE (TOWEL DISPOSABLE) ×2 IMPLANT
TRAY LAPAROSCOPIC MC (CUSTOM PROCEDURE TRAY) ×2 IMPLANT
TROCAR XCEL BLUNT TIP 100MML (ENDOMECHANICALS) ×2 IMPLANT
TROCAR XCEL NON-BLD 5MMX100MML (ENDOMECHANICALS) ×2 IMPLANT
WATER STERILE IRR 1000ML POUR (IV SOLUTION) ×2 IMPLANT

## 2018-10-19 NOTE — Op Note (Signed)
10/18/2018 - 10/19/2018  11:50 AM  PATIENT:  Joe Gonzales  73 y.o. male  PRE-OPERATIVE DIAGNOSIS:  Acute cholecystitis  POST-OPERATIVE DIAGNOSIS:  Acute cholecystitis  PROCEDURE:  Procedure(s): LAPAROSCOPIC CHOLECYSTECTOMY   SURGEON:  Surgeon(s): Georganna Skeans, MD  ASSISTANTS: none   ANESTHESIA:   local and general  EBL:  Total I/O In: 800 [I.V.:800] Out: -   BLOOD ADMINISTERED:none  DRAINS: none   SPECIMEN:  Excision  DISPOSITION OF SPECIMEN:  PATHOLOGY  COUNTS:  YES  DICTATION: .Dragon Dictation Findings: Significant inflammation, cystic duct too short to perform cholangiogram  Procedure in detail: Informed consent was obtained.  He received intravenous antibiotics.  He was brought to the operating room and general endotracheal anesthesia was administered by the anesthesia staff.  His abdomen was prepped and draped in a sterile fashion.  We did a timeout procedure.The Supraumbilical region was infiltrated with local. Supraumbilical incision was made. Subcutaneous tissues were dissected down revealing the anterior fascia. This was divided sharply along the midline. Peritoneal cavity was entered under direct vision without complication. A 0 Vicryl pursestring was placed around the fascial opening. Hassan trocar was inserted into the abdomen. The abdomen was insufflated with carbon dioxide in standard fashion. Under direct vision a 5 mm epigastric and 5 mm right abdominal port x2 were placed.  Local was used at each port site.  Exploration revealed the gallbladder to be covered with omentum.  This was gradually peeled away in a gentle fashion revealing the dome.  The dome was retracted superior medially.  The omentum was swept off of the body and finally the infundibulum of the gallbladder.  The infundibulum was retracted inferior laterally.  I first encountered a anterior branch of the cystic artery.  This was clipped 3 times proximally and divided.  Further dissection  revealed the cystic duct.  This was noted to be quite short and there was a lot of inflammation.  Decision was made not to do a cholangiogram.  Critical view of safety was achieved and then the cystic duct was clipped 3 times proximally.  It was clipped once distally and divided.  A small posterior branch of the cystic artery was clipped and divided.  The gallbladder was taken off the liver bed using cautery.  I did encounter 1 additional small branch of the cystic artery which was clipped twice proximally.  The gallbladder was taken off the liver bed and placed into a bag.  It was removed from the abdomen and sent to pathology.  The liver bed was cauterized to get excellent hemostasis.  The clips were all checked and remained in good position.  There was no bleeding.  The abdomen was copiously irrigated with saline.  Irrigation was clear.  Liver bed remained dry.  Ports were removed under direct vision.  Pneumoperitoneum was released.  Supraumbilical fascia was closed by tying the pursestring.  All 4 wounds were irrigated and the skin of each was closed with 4-0 Vicryl followed by Dermabond.  All counts were correct.  He tolerated the procedure well without apparent complication and was taken recovery in stable condition. PATIENT DISPOSITION:  PACU - hemodynamically stable.   Delay start of Pharmacological VTE agent (>24hrs) due to surgical blood loss or risk of bleeding:  no  Georganna Skeans, MD, MPH, FACS Pager: [phone]  6/14/202011:50 AM

## 2018-10-19 NOTE — Anesthesia Postprocedure Evaluation (Signed)
Anesthesia Post Note  Patient: Joe Gonzales  Procedure(s) Performed: LAPAROSCOPIC CHOLECYSTECTOMY WITH INTRAOPERATIVE CHOLANGIOGRAM (N/A Abdomen)     Patient location during evaluation: PACU Anesthesia Type: General Level of consciousness: awake and alert Pain management: pain level controlled Vital Signs Assessment: post-procedure vital signs reviewed and stable Respiratory status: spontaneous breathing, nonlabored ventilation, respiratory function stable and patient connected to nasal cannula oxygen Cardiovascular status: blood pressure returned to baseline and stable Postop Assessment: no apparent nausea or vomiting Anesthetic complications: no    Last Vitals:  Vitals:   10/19/18 1315 10/19/18 1330  BP: 137/66 126/63  Pulse: 94 96  Resp:  17  Temp:    SpO2: 95% 95%    Last Pain:  Vitals:   10/19/18 0924  TempSrc:   PainSc: 0-No pain                 Quinlan Vollmer,W. EDMOND

## 2018-10-19 NOTE — Plan of Care (Signed)
  Problem: Health Behavior/Discharge Planning: Goal: Ability to manage health-related needs will improve Outcome: Progressing   Problem: Activity: Goal: Risk for activity intolerance will decrease Outcome: Progressing   Problem: Coping: Goal: Level of anxiety will decrease Outcome: Progressing   

## 2018-10-19 NOTE — Discharge Instructions (Signed)
° ° °Managing Your Pain After Surgery Without Opioids ° ° ° °Thank you for participating in our program to help patients manage their pain after surgery without opioids. This is part of our effort to provide you with the best care possible, without exposing you or your family to the risk that opioids pose. ° °What pain can I expect after surgery? °You can expect to have some pain after surgery. This is normal. The pain is typically worse the day after surgery, and quickly begins to get better. °Many studies have found that many patients are able to manage their pain after surgery with Over-the-Counter (OTC) medications such as Tylenol and Motrin. If you have a condition that does not allow you to take Tylenol or Motrin, notify your surgical team. ° °How will I manage my pain? °The best strategy for controlling your pain after surgery is around the clock pain control with Tylenol (acetaminophen) and Motrin (ibuprofen or Advil). Alternating these medications with each other allows you to maximize your pain control. In addition to Tylenol and Motrin, you can use heating pads or ice packs on your incisions to help reduce your pain. ° °How will I alternate your regular strength over-the-counter pain medication? °You will take a dose of pain medication every three hours. °; Start by taking 650 mg of Tylenol (2 pills of 325 mg) °; 3 hours later take 600 mg of Motrin (3 pills of 200 mg) °; 3 hours after taking the Motrin take 650 mg of Tylenol °; 3 hours after that take 600 mg of Motrin. ° ° °- 1 - ° °See example - if your first dose of Tylenol is at 12:00 PM ° ° °12:00 PM Tylenol 650 mg (2 pills of 325 mg)  °3:00 PM Motrin 600 mg (3 pills of 200 mg)  °6:00 PM Tylenol 650 mg (2 pills of 325 mg)  °9:00 PM Motrin 600 mg (3 pills of 200 mg)  °Continue alternating every 3 hours  ° °We recommend that you follow this schedule around-the-clock for at least 3 days after surgery, or until you feel that it is no longer needed. Use  the table on the last page of this handout to keep track of the medications you are taking. °Important: °Do not take more than 3000mg of Tylenol or 3200mg of Motrin in a 24-hour period. °Do not take ibuprofen/Motrin if you have a history of bleeding stomach ulcers, severe kidney disease, &/or actively taking a blood thinner ° °What if I still have pain? °If you have pain that is not controlled with the over-the-counter pain medications (Tylenol and Motrin or Advil) you might have what we call “breakthrough” pain. You will receive a prescription for a small amount of an opioid pain medication such as Oxycodone, Tramadol, or Tylenol with Codeine. Use these opioid pills in the first 24 hours after surgery if you have breakthrough pain. Do not take more than 1 pill every 4-6 hours. ° °If you still have uncontrolled pain after using all opioid pills, don't hesitate to call our staff using the number provided. We will help make sure you are managing your pain in the best way possible, and if necessary, we can provide a prescription for additional pain medication. ° ° °Day 1   ° °Time  °Name of Medication Number of pills taken  °Amount of Acetaminophen  °Pain Level  ° °Comments  °AM PM       °AM PM       °AM PM       °  AM PM       °AM PM       °AM PM       °AM PM       °AM PM       °Total Daily amount of Acetaminophen °Do not take more than  3,000 mg per day    ° ° °Day 2   ° °Time  °Name of Medication Number of pills °taken  °Amount of Acetaminophen  °Pain Level  ° °Comments  °AM PM       °AM PM       °AM PM       °AM PM       °AM PM       °AM PM       °AM PM       °AM PM       °Total Daily amount of Acetaminophen °Do not take more than  3,000 mg per day    ° ° °Day 3   ° °Time  °Name of Medication Number of pills taken  °Amount of Acetaminophen  °Pain Level  ° °Comments  °AM PM       °AM PM       °AM PM       °AM PM       ° ° ° °AM PM       °AM PM       °AM PM       °AM PM       °Total Daily amount of Acetaminophen °Do  not take more than  3,000 mg per day    ° ° °Day 4   ° °Time  °Name of Medication Number of pills taken  °Amount of Acetaminophen  °Pain Level  ° °Comments  °AM PM       °AM PM       °AM PM       °AM PM       °AM PM       °AM PM       °AM PM       °AM PM       °Total Daily amount of Acetaminophen °Do not take more than  3,000 mg per day    ° ° °Day 5   ° °Time  °Name of Medication Number °of pills taken  °Amount of Acetaminophen  °Pain Level  ° °Comments  °AM PM       °AM PM       °AM PM       °AM PM       °AM PM       °AM PM       °AM PM       °AM PM       °Total Daily amount of Acetaminophen °Do not take more than  3,000 mg per day    ° ° ° °Day 6   ° °Time  °Name of Medication Number of pills °taken  °Amount of Acetaminophen  °Pain Level  °Comments  °AM PM       °AM PM       °AM PM       °AM PM       °AM PM       °AM PM       °AM PM       °AM PM       °Total Daily amount of Acetaminophen °Do not take more than    3,000 mg per day    ° ° °Day 7   ° °Time  °Name of Medication Number of pills taken  °Amount of Acetaminophen  °Pain Level  ° °Comments  °AM PM       °AM PM       °AM PM       °AM PM       °AM PM       °AM PM       °AM PM       °AM PM       °Total Daily amount of Acetaminophen °Do not take more than  3,000 mg per day    ° ° ° ° °For additional information about how and where to safely dispose of unused opioid °medications - https://www.morepowerfulnc.org ° °Disclaimer: This document contains information and/or instructional materials adapted from [facility] for the typical patient with your condition. It does not replace medical advice from your health care provider because your experience may differ from that of the °typical patient. Talk to your health care provider if you have any questions about this °document, your condition or your treatment plan. °Adapted from [facility] ° °[facility] °LAPAROSCOPIC SURGERY: POST OP INSTRUCTIONS °Always review your discharge  instruction sheet given to you by the facility where your surgery was performed. °IF YOU HAVE DISABILITY OR FAMILY LEAVE FORMS, YOU MUST BRING THEM TO THE OFFICE FOR PROCESSING.   °DO NOT GIVE THEM TO YOUR DOCTOR. ° °PAIN CONTROL ° °1. First take acetaminophen (Tylenol) AND/or ibuprofen (Advil) to control your pain after surgery.  Follow directions on package.  Taking acetaminophen (Tylenol) and/or ibuprofen (Advil) regularly after surgery will help to control your pain and lower the amount of prescription pain medication you may need.  You should not take more than 3,000 mg (3 grams) of acetaminophen (Tylenol) in 24 hours.  You should not take ibuprofen (Advil), aleve, motrin, naprosyn or other NSAIDS if you have a history of stomach ulcers or chronic kidney disease.  °2. A prescription for pain medication may be given to you upon discharge.  Take your pain medication as prescribed, if you still have uncontrolled pain after taking acetaminophen (Tylenol) or ibuprofen (Advil). °3. Use ice packs to help control pain. °4. If you need a refill on your pain medication, please contact your pharmacy.  They will contact our office to request authorization. Prescriptions will not be filled after 5pm or on week-ends. ° °HOME MEDICATIONS °5. Take your usually prescribed medications unless otherwise directed. ° °DIET °6. You should follow a light diet the first few days after arrival home.  Be sure to include lots of fluids daily. Avoid fatty, fried foods.  ° °CONSTIPATION °7. It is common to experience some constipation after surgery and if you are taking pain medication.  Increasing fluid intake and taking a stool softener (such as Colace) will usually help or prevent this problem from occurring.  A mild laxative (Milk of Magnesia or Miralax) should be taken according to package instructions if there are no bowel movements after 48 hours. ° °WOUND/INCISION CARE °8. Most patients will experience some swelling and bruising in  the area of the incisions.  Ice packs will help.  Swelling and bruising can take several days to resolve.  °9. Unless discharge instructions indicate otherwise, follow guidelines below  °a. STERI-STRIPS - you may remove your outer bandages 48 hours after surgery, and you may shower at that time.  You have steri-strips (  small skin tapes) in place directly over the incision.  These strips should be left on the skin for 7-10 days.   °b. DERMABOND/SKIN GLUE - you may shower in 24 hours.  The glue will flake off over the next 2-3 weeks. °10. Any sutures or staples will be removed at the office during your follow-up visit. ° °ACTIVITIES °11. You may resume regular (light) daily activities beginning the next day--such as daily self-care, walking, climbing stairs--gradually increasing activities as tolerated.  You may have sexual intercourse when it is comfortable.  Refrain from any heavy lifting or straining until approved by your doctor. °a. You may drive when you are no longer taking prescription pain medication, you can comfortably wear a seatbelt, and you can safely maneuver your car and apply brakes. ° °FOLLOW-UP °12. You should see your doctor in the office for a follow-up appointment approximately 2-3 weeks after your surgery.  You should have been given your post-op/follow-up appointment when your surgery was scheduled.  If you did not receive a post-op/follow-up appointment, make sure that you call for this appointment within a day or two after you arrive home to insure a convenient appointment time. ° ° °WHEN TO CALL YOUR DOCTOR: °1. Fever over 101.0 °2. Inability to urinate °3. Continued bleeding from incision. °4. Increased pain, redness, or drainage from the incision. °5. Increasing abdominal pain ° °The clinic staff is available to answer your questions during regular business hours.  Please don’t hesitate to call and ask to speak to one of the nurses for clinical concerns.  If you have a medical emergency,  go to the nearest emergency room or call 911.  A surgeon from [facility] is always on call at the hospital. °[address], [address], [place], [place]  [address] ? [address] [address], [place],    [address] °[phone] ? [phone] ? FAX [phone] °Web site: [email or web address] ° °

## 2018-10-19 NOTE — Transfer of Care (Signed)
Immediate Anesthesia Transfer of Care Note  Patient: Joe Gonzales  Procedure(s) Performed: LAPAROSCOPIC CHOLECYSTECTOMY WITH INTRAOPERATIVE CHOLANGIOGRAM (N/A Abdomen)  Patient Location: PACU  Anesthesia Type:General  Level of Consciousness: drowsy and patient cooperative  Airway & Oxygen Therapy: Patient Spontanous Breathing  Post-op Assessment: Report given to RN, Post -op Vital signs reviewed and stable and Patient moving all extremities X 4  Post vital signs: Reviewed and stable  Last Vitals:  Vitals Value Taken Time  BP    Temp    Pulse 77 10/19/18 1207  Resp 11 10/19/18 1207  SpO2 93 % 10/19/18 1207  Vitals shown include unvalidated device data.  Last Pain:  Vitals:   10/19/18 0924  TempSrc:   PainSc: 0-No pain         Complications: No apparent anesthesia complications

## 2018-10-19 NOTE — Anesthesia Preprocedure Evaluation (Addendum)
Anesthesia Evaluation  Patient identified by MRN, date of birth, ID band Patient awake    Reviewed: Allergy & Precautions, H&P , NPO status , Patient's Chart, lab work & pertinent test results  Airway Mallampati: II  TM Distance: >3 FB Neck ROM: Full    Dental no notable dental hx. (+) Teeth Intact, Dental Advisory Given   Pulmonary asthma , former smoker,    Pulmonary exam normal breath sounds clear to auscultation       Cardiovascular negative cardio ROS   Rhythm:Regular Rate:Normal     Neuro/Psych negative neurological ROS  negative psych ROS   GI/Hepatic negative GI ROS, Neg liver ROS,   Endo/Other  negative endocrine ROS  Renal/GU negative Renal ROS  negative genitourinary   Musculoskeletal  (+) Arthritis ,   Abdominal   Peds  Hematology negative hematology ROS (+)   Anesthesia Other Findings   Reproductive/Obstetrics negative OB ROS                            Anesthesia Physical Anesthesia Plan  ASA: II  Anesthesia Plan: General   Post-op Pain Management:    Induction: Intravenous  PONV Risk Score and Plan: 3 and Ondansetron, Dexamethasone and Treatment may vary due to age or medical condition  Airway Management Planned: Oral ETT  Additional Equipment:   Intra-op Plan:   Post-operative Plan: Extubation in OR  Informed Consent: I have reviewed the patients History and Physical, chart, labs and discussed the procedure including the risks, benefits and alternatives for the proposed anesthesia with the patient or authorized representative who has indicated his/her understanding and acceptance.     Dental advisory given  Plan Discussed with: CRNA  Anesthesia Plan Comments:         Anesthesia Quick Evaluation

## 2018-10-19 NOTE — Care Management Obs Status (Signed)
[facility] NOTIFICATION   Patient Details  Name: CURBY CARSWELL MRN: [id number] Date of Birth: Feb 20, 1946   Medicare Observation Status Notification Given:  Yes    Claudie Leach, RN 10/19/2018, 5:16 PM

## 2018-10-19 NOTE — Progress Notes (Signed)
PHARMACY - PHYSICIAN COMMUNICATION CRITICAL VALUE ALERT - BLOOD CULTURE IDENTIFICATION (BCID)  Joe Gonzales is an 73 y.o. male who presented to [facility] on 10/18/2018 with a chief complaint of RUQ pain.  Assessment: 36 YOM on antibiotics for acute cholecystitis with plans for lap chole with cholangiogram. Blood cultures are now showing 2/2 GNR with BCID showing E.coli. The patient is on appropriate antibiotics - no changes are needed at this time.   Name of physician (or Provider) Contacted: Grandville Silos  Current antibiotics: Rocephin 2g IV every 24 hours  Changes to prescribed antibiotics recommended:  Patient is on recommended antibiotics - No changes needed  Results for orders placed or performed during the hospital encounter of 10/18/18  Blood Culture ID Panel (Reflexed) (Collected: 10/18/2018  5:55 PM)  Result Value Ref Range   Enterococcus species NOT DETECTED NOT DETECTED   Listeria monocytogenes NOT DETECTED NOT DETECTED   Staphylococcus species NOT DETECTED NOT DETECTED   Staphylococcus aureus (BCID) NOT DETECTED NOT DETECTED   Streptococcus species NOT DETECTED NOT DETECTED   Streptococcus agalactiae NOT DETECTED NOT DETECTED   Streptococcus pneumoniae NOT DETECTED NOT DETECTED   Streptococcus pyogenes NOT DETECTED NOT DETECTED   Acinetobacter baumannii NOT DETECTED NOT DETECTED   Enterobacteriaceae species DETECTED (A) NOT DETECTED   Enterobacter cloacae complex NOT DETECTED NOT DETECTED   Escherichia coli DETECTED (A) NOT DETECTED   Klebsiella oxytoca NOT DETECTED NOT DETECTED   Klebsiella pneumoniae NOT DETECTED NOT DETECTED   Proteus species NOT DETECTED NOT DETECTED   Serratia marcescens NOT DETECTED NOT DETECTED   Carbapenem resistance NOT DETECTED NOT DETECTED   Haemophilus influenzae NOT DETECTED NOT DETECTED   Neisseria meningitidis NOT DETECTED NOT DETECTED   Pseudomonas aeruginosa NOT DETECTED NOT DETECTED   Candida albicans NOT DETECTED NOT DETECTED   Candida glabrata NOT DETECTED NOT DETECTED   Candida krusei NOT DETECTED NOT DETECTED   Candida parapsilosis NOT DETECTED NOT DETECTED   Candida tropicalis NOT DETECTED NOT DETECTED    Thank you for allowing pharmacy to be a part of this patient's care.  Alycia Rossetti, PharmD, BCPS Clinical Pharmacist Clinical phone for 10/19/2018: [phone] 10/19/2018 11:32 AM   **Pharmacist phone directory can now be found on [email or web address] (PW [email or web address]).  Listed under [place].

## 2018-10-19 NOTE — Progress Notes (Signed)
Subjective/Chief Complaint: Less RUQ pain   Objective: Vital signs in last 24 hours: Temp:  [99.3 F (37.4 C)-101.6 F (38.7 C)] 100.5 F (38.1 C) (06/14 0448) Pulse Rate:  [78-111] 82 (06/14 0448) Resp:  [13-23] 17 (06/13 2129) BP: (91-163)/(49-80) 123/67 (06/14 0448) SpO2:  [94 %-97 %] 95 % (06/14 0448) Weight:  [92.1 kg] 92.1 kg (06/13 1701) Last BM Date: 10/17/18  Intake/Output from previous day: 06/13 0701 - 06/14 0700 In: -  Out: 300 [Urine:300] Intake/Output this shift: No intake/output data recorded.  General appearance: alert and cooperative Resp: clear to auscultation bilaterally Cardio: regular rate and rhythm GI: soft, mild TTP RUQ  Lab Results:  Recent Labs    10/18/18 1759 10/18/18 2202  WBC 9.3 10.6*  HGB 15.2 13.9  HCT 44.4 41.0  PLT 225 247   BMET Recent Labs    10/18/18 1759 10/19/18 0619  NA 134* 136  K 3.4* 3.2*  CL 103 104  CO2 21* 24  GLUCOSE 114* 115*  BUN 13 10  CREATININE 1.21 1.23  CALCIUM 8.7* 7.7*   PT/INR No results for input(s): LABPROT, INR in the last 72 hours. ABG No results for input(s): PHART, HCO3 in the last 72 hours.  Invalid input(s): PCO2, PO2  Studies/Results: Ct Abdomen Pelvis W Contrast  Result Date: 10/18/2018 CLINICAL DATA:  Severe abdominal pain and nausea yesterday, fever today, suspected diverticulitis EXAM: CT ABDOMEN AND PELVIS WITH CONTRAST TECHNIQUE: Multidetector CT imaging of the abdomen and pelvis was performed using the standard protocol following bolus administration of intravenous contrast. Sagittal and coronal MPR images reconstructed from axial data set. CONTRAST:  171mL OMNIPAQUE IOHEXOL 300 MG/ML SOLN IV. No oral contrast. 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 Lung bases clear. Hepatobiliary: Small hepatic cysts. No other hepatic mass lesions or biliary dilatation. Markedly thickened  gallbladder wall and mild surrounding pericholecystic edema highly suspicious for acute cholecystitis. Pancreas: Normal appearance Spleen: Normal appearance Adrenals/Urinary Tract: Adrenal glands normal appearance. BILATERAL nonobstructing renal calculi. Multiple BILATERAL peripelvic renal cysts. No hydronephrosis or hydroureter. Bladder unremarkable. Stomach/Bowel: Normal appendix. Scattered colonic diverticulosis of descending and sigmoid colon without evidence of diverticulitis. Stomach and remaining bowel loops unremarkable. Vascular/Lymphatic: Atherosclerotic calcifications aorta without aneurysm. No adenopathy. Reproductive: Unremarkable prostate gland and seminal vesicles Other: Small umbilical hernia containing fat. No free air or free fluid. Musculoskeletal: Degenerative disc disease changes lumbar spine. IMPRESSION: Distal colonic diverticulosis without evidence of diverticulitis. Markedly thickened gallbladder wall with mild pericholecystic edema highly suspicious for acute cholecystitis; recommend ultrasound assessment. BILATERAL peripelvic renal cysts and nonobstructing renal calculi. Small BILATERAL posterior Bochdalek's type diaphragmatic hernias containing fat. Small umbilical hernia containing fat. Electronically Signed   By: Lavonia Dana M.D.   On: 10/18/2018 19:25   Dg Chest Port 1 View  Result Date: 10/18/2018 CLINICAL DATA:  Pt reports stomach ache and fever of 102.5 yesterday. Went to UC and was sent here due to temp of 102.2, HR 120, oxygen saturation 89%. Denies cough or SOB. Pt is a former smoker EXAM: PORTABLE CHEST 1 VIEW COMPARISON:  05/09/2018 FINDINGS: Cardiac silhouette is normal in size. No mediastinal or hilar masses or evidence of adenopathy. Mild opacity noted at both lung bases, most likely atelectasis. Cannot exclude infection. Remainder of the lungs is clear. No convincing pleural effusion. No pneumothorax. Skeletal structures are grossly intact. IMPRESSION: 1. Mild lung base  opacity, most likely due to atelectasis. Pneumonia is possible but felt less  likely. No other evidence of acute cardiopulmonary disease. Electronically Signed   By: Lajean Manes M.D.   On: 10/18/2018 18:36   US Abdomen Limited Ruq  Result Date: 10/18/2018 CLINICAL DATA:  Abnormal gallbladder seen on recent CT EXAM: ULTRASOUND ABDOMEN LIMITED RIGHT UPPER QUADRANT COMPARISON:  CT from the same day FINDINGS: Gallbladder: There is diffuse gallbladder wall thickening with the gallbladder wall measuring approximately 7 mm in thickness. The sonographic Percell Miller sign is negative. There is pericholecystic free fluid and gallbladder sludge. Common bile duct: Diameter: 0.2 cm. The majority of the common bile duct was poorly evaluated on this exam secondary to overlying bowel gas and suboptimal sonographic windows. Liver: No focal lesion identified. Within normal limits in parenchymal echogenicity. Portal vein is patent on color Doppler imaging with normal direction of blood flow towards the liver. Multiple right renal peripelvic cysts are noted. IMPRESSION: Gallbladder sludge with pericholecystic free fluid and gallbladder wall thickening. However, the sonographic Percell Miller sign is reported as negative. Overall findings are equivocal for acute cholecystitis. If there is clinical suspicion for acute cholecystitis follow-up with HIDA scan is recommended. Consider surgical consultation. Electronically Signed   By: Constance Holster M.D.   On: 10/18/2018 20:37    Anti-infectives: Anti-infectives (From admission, onward)   Start     Dose/Rate Route Frequency Ordered Stop   10/19/18 2100  cefTRIAXone (ROCEPHIN) 2 g in sodium chloride 0.9 % 100 mL IVPB     2 g 200 mL/hr over 30 Minutes Intravenous Every 24 hours 10/18/18 2139     10/18/18 1945  cefTRIAXone (ROCEPHIN) 2 g in sodium chloride 0.9 % 100 mL IVPB     2 g 200 mL/hr over 30 Minutes Intravenous  Once 10/18/18 1935 10/18/18 2222       Assessment/Plan: Cholecystitis with cholelithiasis - lipase and LFTs improving, for laparoscopic cholecystectomy with cholangiogram today. Procedure, risks, and benefits discussed and he agrees.  ID - Rocephin IV  LOS: 0 days    Zenovia Jarred 10/19/2018

## 2018-10-19 NOTE — Anesthesia Procedure Notes (Signed)
Procedure Name: Intubation Date/Time: 10/19/2018 11:10 AM Performed by: Julieta Bellini, CRNA Pre-anesthesia Checklist: Patient identified, Emergency Drugs available, Suction available and Patient being monitored Patient Re-evaluated:Patient Re-evaluated prior to induction Oxygen Delivery Method: Circle system utilized Preoxygenation: Pre-oxygenation with 100% oxygen Induction Type: IV induction and Rapid sequence Ventilation: Mask ventilation without difficulty Laryngoscope Size: Mac and 4 Grade View: Grade II Tube type: Oral Tube size: 7.5 mm Number of attempts: 1 Airway Equipment and Method: Stylet and Oral airway Placement Confirmation: ETT inserted through vocal cords under direct vision,  positive ETCO2 and breath sounds checked- equal and bilateral Secured at: 22 cm Tube secured with: Tape Dental Injury: Teeth and Oropharynx as per pre-operative assessment

## 2018-10-20 ENCOUNTER — Encounter (HOSPITAL_COMMUNITY): Payer: Self-pay | Admitting: General Surgery

## 2018-10-20 LAB — URINE CULTURE: Culture: NO GROWTH

## 2018-10-20 MED ORDER — CEFDINIR 300 MG PO CAPS: 600.0000 mg | ORAL_CAPSULE | Freq: Every day | ORAL | 0 refills | Status: AC

## 2018-10-20 MED ORDER — ACETAMINOPHEN 500 MG PO TABS: 1000.0000 mg | ORAL_TABLET | Freq: Four times a day (QID) | ORAL | 0 refills | Status: AC | PRN

## 2018-10-20 NOTE — Discharge Summary (Signed)
[facility] Discharge Summary   Patient ID: YAZEN ROSKO MRN: [id number] DOB/AGE: 1945/09/05 73 y.o.  Admit date: 10/18/2018 Discharge date: 10/20/2018  Admitting Diagnosis: Acute cholecystitis  Discharge Diagnosis Acute cholecystitis  Consultants None  Imaging: Ct Abdomen Pelvis W Contrast  Result Date: 10/18/2018 CLINICAL DATA:  Severe abdominal pain and nausea yesterday, fever today, suspected diverticulitis EXAM: CT ABDOMEN AND PELVIS WITH CONTRAST TECHNIQUE: Multidetector CT imaging of the abdomen and pelvis was performed using the standard protocol following bolus administration of intravenous contrast. Sagittal and coronal MPR images reconstructed from axial data set. CONTRAST:  126mL OMNIPAQUE IOHEXOL 300 MG/ML SOLN IV. No oral contrast. 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 Lung bases clear. Hepatobiliary: Small hepatic cysts. No other hepatic mass lesions or biliary dilatation. Markedly thickened gallbladder wall and mild surrounding pericholecystic edema highly suspicious for acute cholecystitis. Pancreas: Normal appearance Spleen: Normal appearance Adrenals/Urinary Tract: Adrenal glands normal appearance. BILATERAL nonobstructing renal calculi. Multiple BILATERAL peripelvic renal cysts. No hydronephrosis or hydroureter. Bladder unremarkable. Stomach/Bowel: Normal appendix. Scattered colonic diverticulosis of descending and sigmoid colon without evidence of diverticulitis. Stomach and remaining bowel loops unremarkable. Vascular/Lymphatic: Atherosclerotic calcifications aorta without aneurysm. No adenopathy. Reproductive: Unremarkable prostate gland and seminal vesicles Other: Small umbilical hernia containing fat. No free air or free fluid. Musculoskeletal: Degenerative disc disease changes lumbar spine. IMPRESSION: Distal colonic diverticulosis without evidence of  diverticulitis. Markedly thickened gallbladder wall with mild pericholecystic edema highly suspicious for acute cholecystitis; recommend ultrasound assessment. BILATERAL peripelvic renal cysts and nonobstructing renal calculi. Small BILATERAL posterior Bochdalek's type diaphragmatic hernias containing fat. Small umbilical hernia containing fat. Electronically Signed   By: Lavonia Dana M.D.   On: 10/18/2018 19:25   Dg Chest Port 1 View  Result Date: 10/18/2018 CLINICAL DATA:  Pt reports stomach ache and fever of 102.5 yesterday. Went to UC and was sent here due to temp of 102.2, HR 120, oxygen saturation 89%. Denies cough or SOB. Pt is a former smoker EXAM: PORTABLE CHEST 1 VIEW COMPARISON:  05/09/2018 FINDINGS: Cardiac silhouette is normal in size. No mediastinal or hilar masses or evidence of adenopathy. Mild opacity noted at both lung bases, most likely atelectasis. Cannot exclude infection. Remainder of the lungs is clear. No convincing pleural effusion. No pneumothorax. Skeletal structures are grossly intact. IMPRESSION: 1. Mild lung base opacity, most likely due to atelectasis. Pneumonia is possible but felt less likely. No other evidence of acute cardiopulmonary disease. Electronically Signed   By: Lajean Manes M.D.   On: 10/18/2018 18:36   US Abdomen Limited Ruq  Result Date: 10/18/2018 CLINICAL DATA:  Abnormal gallbladder seen on recent CT EXAM: ULTRASOUND ABDOMEN LIMITED RIGHT UPPER QUADRANT COMPARISON:  CT from the same day FINDINGS: Gallbladder: There is diffuse gallbladder wall thickening with the gallbladder wall measuring approximately 7 mm in thickness. The sonographic Percell Miller sign is negative. There is pericholecystic free fluid and gallbladder sludge. Common bile duct: Diameter: 0.2 cm. The majority of the common bile duct was poorly evaluated on this exam secondary to overlying bowel gas and suboptimal sonographic windows. Liver: No focal lesion identified. Within normal limits in  parenchymal echogenicity. Portal vein is patent on color Doppler imaging with normal direction of blood flow towards the liver. Multiple right renal peripelvic cysts are noted. IMPRESSION: Gallbladder sludge with pericholecystic free fluid and gallbladder wall thickening. However, the sonographic Percell Miller sign is reported as negative. Overall findings are equivocal for acute cholecystitis. If there  is clinical suspicion for acute cholecystitis follow-up with HIDA scan is recommended. Consider surgical consultation. Electronically Signed   By: Constance Holster M.D.   On: 10/18/2018 20:37    Procedures Dr. Grandville Silos (10/19/18) - Laparoscopic Cholecystectomy   Hospital Course:  Patient is a 73 year old male who presented to [facility] with abdominal pain.  Workup showed acute cholecystitis.  Patient was admitted and underwent procedure listed above.  Tolerated procedure well and was transferred to the floor.  Diet was advanced as tolerated.  On POD#1, the patient was voiding well, tolerating diet, ambulating well, pain well controlled, vital signs stable, incisions c/d/i and felt stable for discharge home.  Patient will follow up with our office in 2 weeks and knows to call with questions or concerns.  He will call to confirm appointment date/time.    Physical Exam: General:  Alert, NAD, pleasant, comfortable Abd:  Soft, ND, mild tenderness, incisions C/D/I   Allergies as of 10/20/2018   No Known Allergies     Medication List    TAKE these medications   acetaminophen 500 MG tablet Commonly known as: TYLENOL Take 2 tablets (1,000 mg total) by mouth every 6 (six) hours as needed for mild pain, fever or headache (pain). What changed: reasons to take this   albuterol 108 (90 Base) MCG/ACT inhaler Commonly known as: ProAir HFA Inhale 1-2 puffs into the lungs every 4 (four) hours as needed for wheezing or shortness of breath.   azelastine 0.1 % nasal spray Commonly known as: ASTELIN Place 2 sprays into  both nostrils 2 (two) times daily.   budesonide-formoterol 160-4.5 MCG/ACT inhaler Commonly known as: Symbicort Inhale 2 puffs into the lungs 2 (two) times daily. What changed:   when to take this  reasons to take this   cetirizine 10 MG tablet Commonly known as: ZYRTEC Take 10 mg by mouth daily as needed for allergies or rhinitis.   fluticasone 50 MCG/ACT nasal spray Commonly known as: FLONASE Place 2 sprays into both nostrils daily.   meloxicam 15 MG tablet Commonly known as: MOBIC Take 15 mg by mouth daily with supper.   NAPHCON-A OP Place 1 drop into both eyes daily as needed (red eyes). "Alcon"   rOPINIRole 3 MG tablet Commonly known as: REQUIP Take 3 mg by mouth at bedtime.        Follow-up Information    [facility].   Specialty: General Surgery Why: Call to confirm appointment date/time. A provider will call you during scheduled appointment time. Please send a photo of incisions with name and DOB to [email or web address] the day prior to appointment.  Contact information: [address] [place] [address] [phone]           Signed: Brigid Re, [facility] 10/20/2018, 8:27 AM Pager: [phone] Consults: [phone]

## 2018-10-21 LAB — CULTURE, BLOOD (ROUTINE X 2)

## 2019-12-01 ENCOUNTER — Ambulatory Visit: Payer: Self-pay | Admitting: General Surgery

## 2019-12-01 NOTE — H&P (Signed)
Blondell Reveal [place]enderfer Appointment: 12/01/2019 2:10 PM Location: [facility] Patient #: [id number] DOB: 1945/06/09 Single / Language: Cleophus Molt / Race: White Male   History of Present Illness Lavone Neri E. Grandville Silos MD; 12/01/2019 2:43 PM) The patient is a 74 year old male who presents with an incisional hernia. I know Toris well status post urgent laparoscopic cholecystectomy in June 2020. He developed some discomfort above his umbilicus over the past few months. He was concerned he had hernia and he saw his primary care physician. He was also seen in the emergency department when he had some pain in the area. No imaging was done but his primary care physician diagnosed him with an incisional hernia. He occasionally has some localized pain. No bowel symptoms. It seems to reduce when he lays down.   Past Surgical History Darden Palmer, [place]; 12/01/2019 2:14 PM) Gallbladder Surgery - Laparoscopic  Tonsillectomy   Diagnostic Studies History Lattie Haw [place], [place]; 12/01/2019 2:14 PM) Colonoscopy  1-5 years ago  Allergies Darden Palmer, [place]; 12/01/2019 2:15 PM) No Known Drug Allergies  [12/01/2019]:  Medication History Darden Palmer, RMA; 12/01/2019 2:15 PM) Meloxicam (15MG  Tablet, Oral) Active. rOPINIRole HCl (3MG  Tablet, Oral) Active.  Social History Lattie Haw [place], [place]; 12/01/2019 2:14 PM) Alcohol use  Moderate alcohol use. Caffeine use  Coffee. No drug use  Tobacco use  Former smoker.  Family History Darden Palmer, [place]; 12/01/2019 2:14 PM) Breast Cancer  Sister. Colon Cancer  Mother. Respiratory Condition  Father.  Other Problems Darden Palmer, [place]; 12/01/2019 2:14 PM) Arthritis  Back Pain  Cholelithiasis  Diverticulosis     Review of Systems Darden Palmer RMA; 12/01/2019 2:14 PM) General Not Present- Appetite Loss, Chills, Fatigue, Fever, Night Sweats, Weight Gain and Weight Loss. Skin Not Present- Change in Wart/Mole, Dryness, Hives, Jaundice, New  Lesions, Non-Healing Wounds, Rash and Ulcer. HEENT Present- Seasonal Allergies. Not Present- Earache, Hearing Loss, Hoarseness, Nose Bleed, Oral Ulcers, Ringing in the Ears, Sinus Pain, Sore Throat, Visual Disturbances, Wears glasses/contact lenses and Yellow Eyes. Respiratory Not Present- Bloody sputum, Chronic Cough, Difficulty Breathing, Snoring and Wheezing. Breast Not Present- Breast Mass, Breast Pain, Nipple Discharge and Skin Changes. Cardiovascular Present- Leg Cramps. Not Present- Chest Pain, Difficulty Breathing Lying Down, Palpitations, Rapid Heart Rate, Shortness of Breath and Swelling of Extremities. Gastrointestinal Not Present- Abdominal Pain, Bloating, Bloody Stool, Change in Bowel Habits, Chronic diarrhea, Constipation, Difficulty Swallowing, Excessive gas, Gets full quickly at meals, Hemorrhoids, Indigestion, Nausea, Rectal Pain and Vomiting. Male Genitourinary Not Present- Blood in Urine, Change in Urinary Stream, Frequency, Impotence, Nocturia, Painful Urination, Urgency and Urine Leakage. Musculoskeletal Present- Back Pain. Not Present- Joint Pain, Joint Stiffness, Muscle Pain, Muscle Weakness and Swelling of Extremities. Neurological Not Present- Decreased Memory, Fainting, Headaches, Numbness, Seizures, Tingling, Tremor, Trouble walking and Weakness. Psychiatric Not Present- Anxiety, Bipolar, Change in Sleep Pattern, Depression, Fearful and Frequent crying. Endocrine Not Present- Cold Intolerance, Excessive Hunger, Hair Changes, Heat Intolerance, Hot flashes and New Diabetes. Hematology Not Present- Blood Thinners, Easy Bruising, Excessive bleeding, Gland problems, HIV and Persistent Infections.  Vitals Lattie Haw [place] RMA; 12/01/2019 2:16 PM) 12/01/2019 2:16 PM Weight: 198.13 lb Height: 68in Body Surface Area: 2.04 m Body Mass Index: 30.12 kg/m  Temp.: 97.32F  Pulse: 88 (Regular)  P.OX: 98% (Room air) BP: 122/74(Sitting, Left Arm, Standard)       Physical  Exam Lavone Neri E. Grandville Silos MD; 12/01/2019 2:43 PM) General Mental Status-Alert. General Appearance-Consistent with stated age. Hydration-Well hydrated. Voice-Normal.  Head and Neck Head-normocephalic, atraumatic with no lesions or  palpable masses. Trachea-midline. Thyroid Gland Characteristics - normal size and consistency.  Eye Eyeball - Bilateral-Extraocular movements intact. Sclera/Conjunctiva - Bilateral-No scleral icterus.  Chest and Lung Exam Chest and lung exam reveals -quiet, even and easy respiratory effort with no use of accessory muscles and on auscultation, normal breath sounds, no adventitious sounds and normal vocal resonance. Inspection Chest Wall - Normal. Back - normal.  Cardiovascular Cardiovascular examination reveals -normal heart sounds, regular rate and rhythm with no murmurs and normal pedal pulses bilaterally.  Abdomen Inspection Hernias - Incisional - Reducible. Palpation/Percussion Palpation and Percussion of the abdomen reveal - Soft, Non Tender, No Rebound tenderness, No Rigidity (guarding) and No hepatosplenomegaly. Auscultation Auscultation of the abdomen reveals - Bowel sounds normal.  Neurologic Neurologic evaluation reveals -alert and oriented x 3 with no impairment of recent or remote memory. Mental Status-Normal.  Musculoskeletal Global Assessment -Note: no gross deformities.  Normal Exam - Left-Upper Extremity Strength Normal and Lower Extremity Strength Normal. Normal Exam - Right-Upper Extremity Strength Normal and Lower Extremity Strength Normal.  Lymphatic Head & Neck  General Head & Neck Lymphatics: Bilateral - Description - Normal. Axillary  General Axillary Region: Bilateral - Description - Normal. Tenderness - Non Tender. Femoral & Inguinal  Generalized Femoral & Inguinal Lymphatics: Bilateral - Description - No Generalized lymphadenopathy.    Assessment & Plan Lavone Neri E. Grandville Silos MD;  12/01/2019 2:45 PM) INCISIONAL HERNIA, WITHOUT OBSTRUCTION OR GANGRENE (K43.2) Impression: Small incisional hernia in the supraumbilical region. The fascial defect is about 2 cm. I feel this is amenable to open repair with mesh with a small incision. I discussed this with him including the procedure, risks, and benefits. We also discussed the postoperative course that is expected. He was eager to have this fixed so it doesn't continue to get larger. He is agreeable.

## 2019-12-01 NOTE — H&P (View-Only) (Signed)
Blondell Reveal [place] Appointment: 12/01/2019 2:10 PM Location: [facility] Patient #: [id number] DOB: 1945/08/11 Single / Language: Cleophus Molt / Race: White Male   History of Present Illness Lavone Neri E. Grandville Silos MD; 12/01/2019 2:43 PM) The patient is a 74 year old male who presents with an incisional hernia. I know Lawerance well status post urgent laparoscopic cholecystectomy in June 2020. He developed some discomfort above his umbilicus over the past few months. He was concerned he had hernia and he saw his primary care physician. He was also seen in the emergency department when he had some pain in the area. No imaging was done but his primary care physician diagnosed him with an incisional hernia. He occasionally has some localized pain. No bowel symptoms. It seems to reduce when he lays down.   Past Surgical History Darden Palmer, [place]; 12/01/2019 2:14 PM) Gallbladder Surgery - Laparoscopic  Tonsillectomy   Diagnostic Studies History Lattie Haw [place], [place]; 12/01/2019 2:14 PM) Colonoscopy  1-5 years ago  Allergies Darden Palmer, [place]; 12/01/2019 2:15 PM) No Known Drug Allergies  [12/01/2019]:  Medication History Darden Palmer, RMA; 12/01/2019 2:15 PM) Meloxicam (15MG  Tablet, Oral) Active. rOPINIRole HCl (3MG  Tablet, Oral) Active.  Social History Lattie Haw [place], [place]; 12/01/2019 2:14 PM) Alcohol use  Moderate alcohol use. Caffeine use  Coffee. No drug use  Tobacco use  Former smoker.  Family History Darden Palmer, [place]; 12/01/2019 2:14 PM) Breast Cancer  Sister. Colon Cancer  Mother. Respiratory Condition  Father.  Other Problems Darden Palmer, [place]; 12/01/2019 2:14 PM) Arthritis  Back Pain  Cholelithiasis  Diverticulosis     Review of Systems Darden Palmer RMA; 12/01/2019 2:14 PM) General Not Present- Appetite Loss, Chills, Fatigue, Fever, Night Sweats, Weight Gain and Weight Loss. Skin Not Present- Change in Wart/Mole, Dryness, Hives, Jaundice, New  Lesions, Non-Healing Wounds, Rash and Ulcer. HEENT Present- Seasonal Allergies. Not Present- Earache, Hearing Loss, Hoarseness, Nose Bleed, Oral Ulcers, Ringing in the Ears, Sinus Pain, Sore Throat, Visual Disturbances, Wears glasses/contact lenses and Yellow Eyes. Respiratory Not Present- Bloody sputum, Chronic Cough, Difficulty Breathing, Snoring and Wheezing. Breast Not Present- Breast Mass, Breast Pain, Nipple Discharge and Skin Changes. Cardiovascular Present- Leg Cramps. Not Present- Chest Pain, Difficulty Breathing Lying Down, Palpitations, Rapid Heart Rate, Shortness of Breath and Swelling of Extremities. Gastrointestinal Not Present- Abdominal Pain, Bloating, Bloody Stool, Change in Bowel Habits, Chronic diarrhea, Constipation, Difficulty Swallowing, Excessive gas, Gets full quickly at meals, Hemorrhoids, Indigestion, Nausea, Rectal Pain and Vomiting. Male Genitourinary Not Present- Blood in Urine, Change in Urinary Stream, Frequency, Impotence, Nocturia, Painful Urination, Urgency and Urine Leakage. Musculoskeletal Present- Back Pain. Not Present- Joint Pain, Joint Stiffness, Muscle Pain, Muscle Weakness and Swelling of Extremities. Neurological Not Present- Decreased Memory, Fainting, Headaches, Numbness, Seizures, Tingling, Tremor, Trouble walking and Weakness. Psychiatric Not Present- Anxiety, Bipolar, Change in Sleep Pattern, Depression, Fearful and Frequent crying. Endocrine Not Present- Cold Intolerance, Excessive Hunger, Hair Changes, Heat Intolerance, Hot flashes and New Diabetes. Hematology Not Present- Blood Thinners, Easy Bruising, Excessive bleeding, Gland problems, HIV and Persistent Infections.  Vitals Lattie Haw [place] RMA; 12/01/2019 2:16 PM) 12/01/2019 2:16 PM Weight: 198.13 lb Height: 68in Body Surface Area: 2.04 m Body Mass Index: 30.12 kg/m  Temp.: 97.67F  Pulse: 88 (Regular)  P.OX: 98% (Room air) BP: 122/74(Sitting, Left Arm, Standard)       Physical  Exam Lavone Neri E. Grandville Silos MD; 12/01/2019 2:43 PM) General Mental Status-Alert. General Appearance-Consistent with stated age. Hydration-Well hydrated. Voice-Normal.  Head and Neck Head-normocephalic, atraumatic with no lesions or  palpable masses. Trachea-midline. Thyroid Gland Characteristics - normal size and consistency.  Eye Eyeball - Bilateral-Extraocular movements intact. Sclera/Conjunctiva - Bilateral-No scleral icterus.  Chest and Lung Exam Chest and lung exam reveals -quiet, even and easy respiratory effort with no use of accessory muscles and on auscultation, normal breath sounds, no adventitious sounds and normal vocal resonance. Inspection Chest Wall - Normal. Back - normal.  Cardiovascular Cardiovascular examination reveals -normal heart sounds, regular rate and rhythm with no murmurs and normal pedal pulses bilaterally.  Abdomen Inspection Hernias - Incisional - Reducible. Palpation/Percussion Palpation and Percussion of the abdomen reveal - Soft, Non Tender, No Rebound tenderness, No Rigidity (guarding) and No hepatosplenomegaly. Auscultation Auscultation of the abdomen reveals - Bowel sounds normal.  Neurologic Neurologic evaluation reveals -alert and oriented x 3 with no impairment of recent or remote memory. Mental Status-Normal.  Musculoskeletal Global Assessment -Note: no gross deformities.  Normal Exam - Left-Upper Extremity Strength Normal and Lower Extremity Strength Normal. Normal Exam - Right-Upper Extremity Strength Normal and Lower Extremity Strength Normal.  Lymphatic Head & Neck  General Head & Neck Lymphatics: Bilateral - Description - Normal. Axillary  General Axillary Region: Bilateral - Description - Normal. Tenderness - Non Tender. Femoral & Inguinal  Generalized Femoral & Inguinal Lymphatics: Bilateral - Description - No Generalized lymphadenopathy.    Assessment & Plan Lavone Neri E. Grandville Silos MD;  12/01/2019 2:45 PM) INCISIONAL HERNIA, WITHOUT OBSTRUCTION OR GANGRENE (K43.2) Impression: Small incisional hernia in the supraumbilical region. The fascial defect is about 2 cm. I feel this is amenable to open repair with mesh with a small incision. I discussed this with him including the procedure, risks, and benefits. We also discussed the postoperative course that is expected. He was eager to have this fixed so it doesn't continue to get larger. He is agreeable.

## 2019-12-22 ENCOUNTER — Other Ambulatory Visit: Payer: Self-pay

## 2019-12-22 ENCOUNTER — Encounter (HOSPITAL_BASED_OUTPATIENT_CLINIC_OR_DEPARTMENT_OTHER): Payer: Self-pay | Admitting: General Surgery

## 2019-12-24 ENCOUNTER — Other Ambulatory Visit (HOSPITAL_COMMUNITY)
Admission: RE | Admit: 2019-12-24 | Discharge: 2019-12-24 | Disposition: A | Discharge status: Home / Self Care | Payer: Medicare HMO | Source: Ambulatory Visit | Attending: General Surgery | Admitting: General Surgery

## 2019-12-24 DIAGNOSIS — Z20822 Contact with and (suspected) exposure to covid-19: Secondary | ICD-10-CM | POA: Insufficient documentation

## 2019-12-24 DIAGNOSIS — Z01812 Encounter for preprocedural laboratory examination: Secondary | ICD-10-CM | POA: Diagnosis present

## 2019-12-24 LAB — SARS CORONAVIRUS 2 (TAT 6-24 HRS): SARS Coronavirus 2: NEGATIVE

## 2019-12-28 ENCOUNTER — Ambulatory Visit (HOSPITAL_BASED_OUTPATIENT_CLINIC_OR_DEPARTMENT_OTHER): Payer: Medicare HMO | Admitting: Anesthesiology

## 2019-12-28 ENCOUNTER — Other Ambulatory Visit: Payer: Self-pay

## 2019-12-28 ENCOUNTER — Ambulatory Visit (HOSPITAL_BASED_OUTPATIENT_CLINIC_OR_DEPARTMENT_OTHER)
Admission: RE | Admit: 2019-12-28 | Discharge: 2019-12-28 | Disposition: A | Discharge status: Home / Self Care | Payer: Medicare HMO | Attending: General Surgery | Admitting: General Surgery

## 2019-12-28 ENCOUNTER — Encounter (HOSPITAL_BASED_OUTPATIENT_CLINIC_OR_DEPARTMENT_OTHER)
Admission: RE | Disposition: A | Discharge status: Home / Self Care | Payer: Self-pay | Source: Home / Self Care | Attending: General Surgery

## 2019-12-28 ENCOUNTER — Encounter (HOSPITAL_BASED_OUTPATIENT_CLINIC_OR_DEPARTMENT_OTHER): Payer: Self-pay | Admitting: General Surgery

## 2019-12-28 DIAGNOSIS — M1990 Unspecified osteoarthritis, unspecified site: Secondary | ICD-10-CM | POA: Insufficient documentation

## 2019-12-28 DIAGNOSIS — K432 Incisional hernia without obstruction or gangrene: Secondary | ICD-10-CM | POA: Diagnosis present

## 2019-12-28 DIAGNOSIS — J45909 Unspecified asthma, uncomplicated: Secondary | ICD-10-CM | POA: Insufficient documentation

## 2019-12-28 DIAGNOSIS — Z791 Long term (current) use of non-steroidal anti-inflammatories (NSAID): Secondary | ICD-10-CM | POA: Diagnosis not present

## 2019-12-28 DIAGNOSIS — Z87891 Personal history of nicotine dependence: Secondary | ICD-10-CM | POA: Diagnosis not present

## 2019-12-28 DIAGNOSIS — Z79899 Other long term (current) drug therapy: Secondary | ICD-10-CM | POA: Diagnosis not present

## 2019-12-28 HISTORY — DX: Unspecified asthma, uncomplicated: J45.909

## 2019-12-28 HISTORY — DX: Restless legs syndrome: G25.81

## 2019-12-28 HISTORY — DX: Umbilical hernia without obstruction or gangrene: K42.9

## 2019-12-28 HISTORY — PX: INSERTION OF MESH: SHX5868

## 2019-12-28 HISTORY — DX: Other complications of anesthesia, initial encounter: T88.59XA

## 2019-12-28 HISTORY — PX: SUPRA-UMBILICAL HERNIA: SHX6105

## 2019-12-28 SURGERY — SUPRA-UMBILICAL HERNIA
Anesthesia: General | Site: Abdomen

## 2019-12-28 MED ORDER — DIPHENHYDRAMINE HCL 50 MG/ML IJ SOLN
INTRAMUSCULAR | Status: DC | PRN
  Administered 2019-12-28: 6.25 mg via INTRAVENOUS

## 2019-12-28 MED ORDER — CEFAZOLIN SODIUM-DEXTROSE 2-4 GM/100ML-% IV SOLN
INTRAVENOUS | Status: AC
  Filled 2019-12-28: qty 100

## 2019-12-28 MED ORDER — LIDOCAINE 2% (20 MG/ML) 5 ML SYRINGE
INTRAMUSCULAR | Status: AC
  Filled 2019-12-28: qty 5

## 2019-12-28 MED ORDER — ONDANSETRON HCL 4 MG/2ML IJ SOLN
INTRAMUSCULAR | Status: DC | PRN
  Administered 2019-12-28: 4 mg via INTRAVENOUS

## 2019-12-28 MED ORDER — LACTATED RINGERS IV SOLN: INTRAVENOUS | Status: DC

## 2019-12-28 MED ORDER — ENSURE PRE-SURGERY PO LIQD: 296.0000 mL | Freq: Once | ORAL | Status: DC

## 2019-12-28 MED ORDER — DEXAMETHASONE SODIUM PHOSPHATE 4 MG/ML IJ SOLN
INTRAMUSCULAR | Status: DC | PRN
  Administered 2019-12-28: 5 mg via INTRAVENOUS

## 2019-12-28 MED ORDER — ACETAMINOPHEN 500 MG PO TABS: 1000.0000 mg | ORAL_TABLET | Freq: Once | ORAL | Status: DC

## 2019-12-28 MED ORDER — EPHEDRINE 5 MG/ML INJ
INTRAVENOUS | Status: AC
  Filled 2019-12-28: qty 10

## 2019-12-28 MED ORDER — FENTANYL CITRATE (PF) 100 MCG/2ML IJ SOLN: 25.0000 ug | INTRAMUSCULAR | Status: DC | PRN

## 2019-12-28 MED ORDER — GABAPENTIN 300 MG PO CAPS
300.0000 mg | ORAL_CAPSULE | ORAL | Status: AC
  Administered 2019-12-28: 300 mg via ORAL

## 2019-12-28 MED ORDER — ACETAMINOPHEN 500 MG PO TABS
ORAL_TABLET | ORAL | Status: AC
  Filled 2019-12-28: qty 2

## 2019-12-28 MED ORDER — DIPHENHYDRAMINE HCL 50 MG/ML IJ SOLN
INTRAMUSCULAR | Status: AC
  Filled 2019-12-28: qty 1

## 2019-12-28 MED ORDER — CEFAZOLIN SODIUM-DEXTROSE 2-4 GM/100ML-% IV SOLN
2.0000 g | INTRAVENOUS | Status: AC
  Administered 2019-12-28: 2 g via INTRAVENOUS

## 2019-12-28 MED ORDER — CELECOXIB 200 MG PO CAPS
ORAL_CAPSULE | ORAL | Status: AC
  Filled 2019-12-28: qty 1

## 2019-12-28 MED ORDER — CHLORHEXIDINE GLUCONATE CLOTH 2 % EX PADS: 6.0000 | MEDICATED_PAD | Freq: Once | CUTANEOUS | Status: DC

## 2019-12-28 MED ORDER — PROPOFOL 10 MG/ML IV BOLUS
INTRAVENOUS | Status: DC | PRN
  Administered 2019-12-28: 150 mg via INTRAVENOUS

## 2019-12-28 MED ORDER — FENTANYL CITRATE (PF) 100 MCG/2ML IJ SOLN
INTRAMUSCULAR | Status: DC | PRN
Start: 2019-12-28 — End: 2019-12-28
  Administered 2019-12-28 (×2): 50 ug via INTRAVENOUS

## 2019-12-28 MED ORDER — SUGAMMADEX SODIUM 200 MG/2ML IV SOLN
INTRAVENOUS | Status: DC | PRN
  Administered 2019-12-28: 200 mg via INTRAVENOUS

## 2019-12-28 MED ORDER — BUPIVACAINE HCL (PF) 0.25 % IJ SOLN
INTRAMUSCULAR | Status: DC | PRN
  Administered 2019-12-28: 20 mL

## 2019-12-28 MED ORDER — ACETAMINOPHEN 500 MG PO TABS
1000.0000 mg | ORAL_TABLET | ORAL | Status: AC
  Administered 2019-12-28: 1000 mg via ORAL

## 2019-12-28 MED ORDER — ONDANSETRON HCL 4 MG/2ML IJ SOLN
INTRAMUSCULAR | Status: AC
  Filled 2019-12-28: qty 2

## 2019-12-28 MED ORDER — ROCURONIUM BROMIDE 10 MG/ML (PF) SYRINGE
PREFILLED_SYRINGE | INTRAVENOUS | Status: AC
  Filled 2019-12-28: qty 10

## 2019-12-28 MED ORDER — SUCCINYLCHOLINE CHLORIDE 200 MG/10ML IV SOSY
PREFILLED_SYRINGE | INTRAVENOUS | Status: AC
  Filled 2019-12-28: qty 10

## 2019-12-28 MED ORDER — GABAPENTIN 300 MG PO CAPS
ORAL_CAPSULE | ORAL | Status: AC
  Filled 2019-12-28: qty 1

## 2019-12-28 MED ORDER — PHENYLEPHRINE 40 MCG/ML (10ML) SYRINGE FOR IV PUSH (FOR BLOOD PRESSURE SUPPORT)
PREFILLED_SYRINGE | INTRAVENOUS | Status: AC
  Filled 2019-12-28: qty 10

## 2019-12-28 MED ORDER — BUPIVACAINE LIPOSOME 1.3 % IJ SUSP: 20.0000 mL | Freq: Once | INTRAMUSCULAR | Status: DC

## 2019-12-28 MED ORDER — FENTANYL CITRATE (PF) 100 MCG/2ML IJ SOLN
INTRAMUSCULAR | Status: AC
  Filled 2019-12-28: qty 2

## 2019-12-28 MED ORDER — ROCURONIUM BROMIDE 100 MG/10ML IV SOLN
INTRAVENOUS | Status: DC | PRN
  Administered 2019-12-28: 40 mg via INTRAVENOUS

## 2019-12-28 MED ORDER — CELECOXIB 200 MG PO CAPS
200.0000 mg | ORAL_CAPSULE | Freq: Once | ORAL | Status: AC
  Administered 2019-12-28: 200 mg via ORAL

## 2019-12-28 SURGICAL SUPPLY — 49 items
ADH SKN CLS APL DERMABOND .7 (GAUZE/BANDAGES/DRESSINGS) ×1
APL PRP STRL LF DISP 70% ISPRP (MISCELLANEOUS) ×1
APL SWBSTK 6 STRL LF DISP (MISCELLANEOUS)
APPLICATOR COTTON TIP 6 STRL (MISCELLANEOUS) IMPLANT
APPLICATOR COTTON TIP 6IN STRL (MISCELLANEOUS)
BLADE CLIPPER SURG (BLADE) ×2 IMPLANT
BLADE HEX COATED 2.75 (ELECTRODE) IMPLANT
BLADE SURG 10 STRL SS (BLADE) IMPLANT
BLADE SURG 15 STRL LF DISP TIS (BLADE) ×1 IMPLANT
BLADE SURG 15 STRL SS (BLADE) ×2
CANISTER SUCT 1200ML W/VALVE (MISCELLANEOUS) IMPLANT
CHLORAPREP W/TINT 26 (MISCELLANEOUS) ×2 IMPLANT
COVER BACK TABLE 60X90IN (DRAPES) ×2 IMPLANT
COVER MAYO STAND STRL (DRAPES) ×2 IMPLANT
COVER WAND RF STERILE (DRAPES) IMPLANT
DECANTER SPIKE VIAL GLASS SM (MISCELLANEOUS) IMPLANT
DERMABOND ADVANCED (GAUZE/BANDAGES/DRESSINGS) ×1
DERMABOND ADVANCED .7 DNX12 (GAUZE/BANDAGES/DRESSINGS) ×1 IMPLANT
DRAPE LAPAROTOMY 100X72 PEDS (DRAPES) ×2 IMPLANT
DRAPE UTILITY XL STRL (DRAPES) ×2 IMPLANT
ELECT REM PT RETURN 9FT ADLT (ELECTROSURGICAL) ×2
ELECTRODE REM PT RTRN 9FT ADLT (ELECTROSURGICAL) ×1 IMPLANT
GLOVE BIO SURGEON STRL SZ 6.5 (GLOVE) ×2 IMPLANT
GLOVE BIO SURGEON STRL SZ8 (GLOVE) ×2 IMPLANT
GLOVE BIOGEL PI IND STRL 7.0 (GLOVE) ×1 IMPLANT
GLOVE BIOGEL PI IND STRL 8 (GLOVE) ×1 IMPLANT
GLOVE BIOGEL PI INDICATOR 7.0 (GLOVE) ×1
GLOVE BIOGEL PI INDICATOR 8 (GLOVE) ×1
GOWN STRL REUS W/ TWL LRG LVL3 (GOWN DISPOSABLE) ×1 IMPLANT
GOWN STRL REUS W/ TWL XL LVL3 (GOWN DISPOSABLE) ×1 IMPLANT
GOWN STRL REUS W/TWL LRG LVL3 (GOWN DISPOSABLE) ×2
GOWN STRL REUS W/TWL XL LVL3 (GOWN DISPOSABLE) ×2
MESH VENTRALEX ST 8CM LRG (Mesh General) ×2 IMPLANT
NEEDLE HYPO 25X1 1.5 SAFETY (NEEDLE) ×2 IMPLANT
NS IRRIG 1000ML POUR BTL (IV SOLUTION) IMPLANT
PACK BASIN DAY SURGERY FS (CUSTOM PROCEDURE TRAY) ×2 IMPLANT
PENCIL SMOKE EVACUATOR (MISCELLANEOUS) ×2 IMPLANT
SLEEVE SCD COMPRESS KNEE MED (MISCELLANEOUS) ×2 IMPLANT
SPONGE LAP 18X18 RF (DISPOSABLE) ×2 IMPLANT
SUT MON AB 4-0 PC3 18 (SUTURE) ×2 IMPLANT
SUT PROLENE 0 CT 2 (SUTURE) ×4 IMPLANT
SUT VIC AB 2-0 SH 27 (SUTURE) ×2
SUT VIC AB 2-0 SH 27XBRD (SUTURE) ×1 IMPLANT
SUT VIC AB 3-0 SH 27 (SUTURE) ×2
SUT VIC AB 3-0 SH 27X BRD (SUTURE) ×1 IMPLANT
SYR CONTROL 10ML LL (SYRINGE) ×2 IMPLANT
TOWEL GREEN STERILE FF (TOWEL DISPOSABLE) ×2 IMPLANT
TUBE CONNECTING 20X1/4 (TUBING) IMPLANT
YANKAUER SUCT BULB TIP NO VENT (SUCTIONS) IMPLANT

## 2019-12-28 NOTE — Op Note (Signed)
  12/28/2019  1:33 PM  PATIENT:  Joe Gonzales  74 y.o. male  PRE-OPERATIVE DIAGNOSIS:  SUBRAUMBILICAL INCISIONAL HERNIA  POST-OPERATIVE DIAGNOSIS:  SUBRAUMBILICAL INCISIONAL HERNIA  PROCEDURE:  Procedure(s): SUPRA-UMBILICAL HERNIA REPAIR WITH MESH INSERTION OF MESH 8CM VENTRALEX ST  SURGEON:  Surgeon(s): Georganna Skeans, MD  ASSISTANTS: none   ANESTHESIA:   local and general  EBL:  Total I/O In: 100 [IV Piggyback:100] Out: 5 [Blood:5]  BLOOD ADMINISTERED:none  DRAINS: none   SPECIMEN:  No Specimen  DISPOSITION OF SPECIMEN:  N/A  COUNTS:  YES  DICTATION: .Dragon Dictation Procedure in detail: Informed consent was obtained.  He received intravenous antibiotics.  He was brought to the operating room and general endotracheal anesthesia was administered by the anesthesia staff.  His abdomen was prepped and draped in a sterile fashion.  We did a timeout procedure.  Supraumbilical region was infiltrated with local.  A supraumbilical incision was made and subcutaneous tissues were dissected down revealing the hernia sac.  The contents were reduced.  The hernia sac was circumferentially dissected from the surrounding fascia and excised.  The fascial defect was about 3 cm across.  I chose an 8 cm Ventralex ST mesh to do an inlay repair.  This was secured with multiple interrupted 0 Prolene sutures.  I was also able to close the fascial defect on top of it incorporating the mesh in the closure.  Subcutaneous tissues were irrigated and hemostasis was obtained.  Deep subcutaneous tissues were closed with running 2-0 Vicryl.  Superficial subcutaneous tissues were closed with interrupted 3-0 Vicryl.  The skin was closed with running 4-0 Monocryl subcuticular followed by Dermabond.  All counts were correct.  He tolerated the procedure well without apparent complication was taken recovery in stable condition. PATIENT DISPOSITION:  PACU - hemodynamically stable.   Delay start of  Pharmacological VTE agent (>24hrs) due to surgical blood loss or risk of bleeding:  no  Georganna Skeans, MD, MPH, FACS Pager: [phone]  8/23/20211:33 PM

## 2019-12-28 NOTE — Transfer of Care (Signed)
Immediate Anesthesia Transfer of Care Note  Patient: Joe Gonzales  Procedure(s) Performed: SUPRA-UMBILICAL HERNIA REPAIR WITH MESH (N/A Abdomen) INSERTION OF MESH (N/A Abdomen)  Patient Location: PACU  Anesthesia Type:General  Level of Consciousness: awake, drowsy and patient cooperative  Airway & Oxygen Therapy: Patient Spontanous Breathing and Patient connected to face mask oxygen  Post-op Assessment: Report given to RN and Post -op Vital signs reviewed and stable  Post vital signs: Reviewed and stable  Last Vitals:  Vitals Value Taken Time  BP    Temp    Pulse    Resp    SpO2      Last Pain:  Vitals:   12/28/19 1109  TempSrc: Oral  PainSc: 0-No pain         Complications: No complications documented.

## 2019-12-28 NOTE — Anesthesia Preprocedure Evaluation (Addendum)
Anesthesia Evaluation  Patient identified by MRN, date of birth, ID band Patient awake    Reviewed: Allergy & Precautions, H&P , NPO status , Patient's Chart, lab work & pertinent test results  Airway Mallampati: III  TM Distance: >3 FB Neck ROM: Full    Dental no notable dental hx. (+) Teeth Intact, Dental Advisory Given   Pulmonary asthma , former smoker,    Pulmonary exam normal breath sounds clear to auscultation       Cardiovascular negative cardio ROS   Rhythm:Regular Rate:Normal     Neuro/Psych negative neurological ROS  negative psych ROS   GI/Hepatic negative GI ROS, Neg liver ROS,   Endo/Other  negative endocrine ROS  Renal/GU negative Renal ROS  negative genitourinary   Musculoskeletal  (+) Arthritis , Osteoarthritis,    Abdominal   Peds  Hematology negative hematology ROS (+)   Anesthesia Other Findings   Reproductive/Obstetrics negative OB ROS                            Anesthesia Physical Anesthesia Plan  ASA: II  Anesthesia Plan: General   Post-op Pain Management:    Induction: Intravenous  PONV Risk Score and Plan: 3 and Ondansetron, Dexamethasone and Midazolam  Airway Management Planned: Oral ETT  Additional Equipment:   Intra-op Plan:   Post-operative Plan: Extubation in OR  Informed Consent: I have reviewed the patients History and Physical, chart, labs and discussed the procedure including the risks, benefits and alternatives for the proposed anesthesia with the patient or authorized representative who has indicated his/her understanding and acceptance.     Dental advisory given  Plan Discussed with: CRNA  Anesthesia Plan Comments:         Anesthesia Quick Evaluation

## 2019-12-28 NOTE — Interval H&P Note (Signed)
History and Physical Interval Note:  12/28/2019 12:32 PM  Joe Gonzales  has presented today for surgery, with the diagnosis of SUBRAUMBILICAL INCISIONAL HERNIA.  The various methods of treatment have been discussed with the patient and family. After consideration of risks, benefits and other options for treatment, the patient has consented to  Procedure(s): SUPRA-UMBILICAL HERNIA REPAIR WITH MESH (N/A) as a surgical intervention.  The patient's history has been reviewed, patient examined, no change in status, stable for surgery.  I have reviewed the patient's chart and labs.  Questions were answered to the patient's satisfaction.     Zenovia Jarred

## 2019-12-28 NOTE — Anesthesia Postprocedure Evaluation (Signed)
Anesthesia Post Note  Patient: Joe Gonzales  Procedure(s) Performed: SUPRA-UMBILICAL HERNIA REPAIR WITH MESH (N/A Abdomen) INSERTION OF MESH (N/A Abdomen)     Patient location during evaluation: PACU Anesthesia Type: General Level of consciousness: awake Pain management: pain level controlled Vital Signs Assessment: post-procedure vital signs reviewed and stable Respiratory status: spontaneous breathing, nonlabored ventilation, respiratory function stable and patient connected to nasal cannula oxygen Cardiovascular status: blood pressure returned to baseline and stable Postop Assessment: no apparent nausea or vomiting Anesthetic complications: no   No complications documented.  Last Vitals:  Vitals:   12/28/19 1348 12/28/19 1400  BP: 127/72 127/69  Pulse: 60 (!) 58  Resp: 14 16  Temp: (!) 36.4 C   SpO2: 99% 99%    Last Pain:  Vitals:   12/28/19 1400  TempSrc:   PainSc: 4                  Rayelle Armor P Kamarri Fischetti

## 2019-12-28 NOTE — Discharge Instructions (Signed)
No Tylenol until 5:12 pm No Ibuprofen or Motrin until 7:15 pm  Post Anesthesia Home Care Instructions  Activity: Get plenty of rest for the remainder of the day. A responsible individual must stay with you for 24 hours following the procedure.  For the next 24 hours, DO NOT: -Drive a car -Paediatric nurse -Drink alcoholic beverages -Take any medication unless instructed by your physician -Make any legal decisions or sign important papers.  Meals: Start with liquid foods such as gelatin or soup. Progress to regular foods as tolerated. Avoid greasy, spicy, heavy foods. If nausea and/or vomiting occur, drink only clear liquids until the nausea and/or vomiting subsides. Call your physician if vomiting continues.  Special Instructions/Symptoms: Your throat may feel dry or sore from the anesthesia or the breathing tube placed in your throat during surgery. If this causes discomfort, gargle with warm salt water. The discomfort should disappear within 24 hours.  If you had a scopolamine patch placed behind your ear for the management of post- operative nausea and/or vomiting:  1. The medication in the patch is effective for 72 hours, after which it should be removed.  Wrap patch in a tissue and discard in the trash. Wash hands thoroughly with soap and water. 2. You may remove the patch earlier than 72 hours if you experience unpleasant side effects which may include dry mouth, dizziness or visual disturbances. 3. Avoid touching the patch. Wash your hands with soap and water after contact with the patch.

## 2019-12-28 NOTE — Anesthesia Procedure Notes (Signed)
Procedure Name: Intubation Date/Time: 12/28/2019 12:45 PM Performed by: Maryella Shivers, CRNA Pre-anesthesia Checklist: Patient identified, Emergency Drugs available, Suction available and Patient being monitored Patient Re-evaluated:Patient Re-evaluated prior to induction Oxygen Delivery Method: Circle system utilized Preoxygenation: Pre-oxygenation with 100% oxygen Induction Type: IV induction Ventilation: Mask ventilation without difficulty Laryngoscope Size: Mac and 3 Grade View: Grade I Tube type: Oral Tube size: 7.0 mm Number of attempts: 1 Airway Equipment and Method: Stylet and Oral airway Placement Confirmation: ETT inserted through vocal cords under direct vision,  positive ETCO2 and breath sounds checked- equal and bilateral Secured at: 21 cm Tube secured with: Tape Dental Injury: Teeth and Oropharynx as per pre-operative assessment

## 2019-12-29 ENCOUNTER — Encounter (HOSPITAL_BASED_OUTPATIENT_CLINIC_OR_DEPARTMENT_OTHER): Payer: Self-pay | Admitting: General Surgery

## 2019-12-29 NOTE — Addendum Note (Signed)
Addendum  created 12/29/19 1115 by Maryella Shivers, CRNA   Charge Capture section accepted

## 2020-06-11 IMAGING — US ULTRASOUND ABDOMEN LIMITED
1 series · 14 of 25 positions shown · non-contrast
Comparison: CT from the same day

CLINICAL DATA: Abnormal gallbladder seen on recent CT

EXAM:
ULTRASOUND ABDOMEN LIMITED RIGHT UPPER QUADRANT

[Series 1: ultrasound abdomen limited · 14 of 58 slices shown]
[im 1/58]
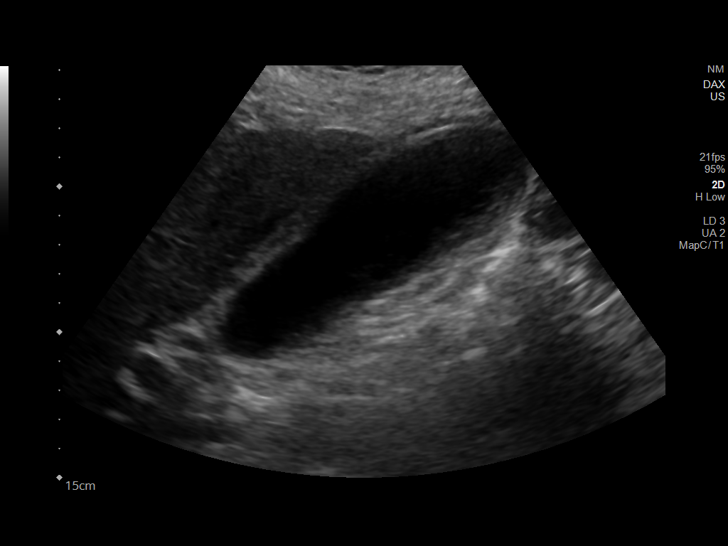
[im 5/58]
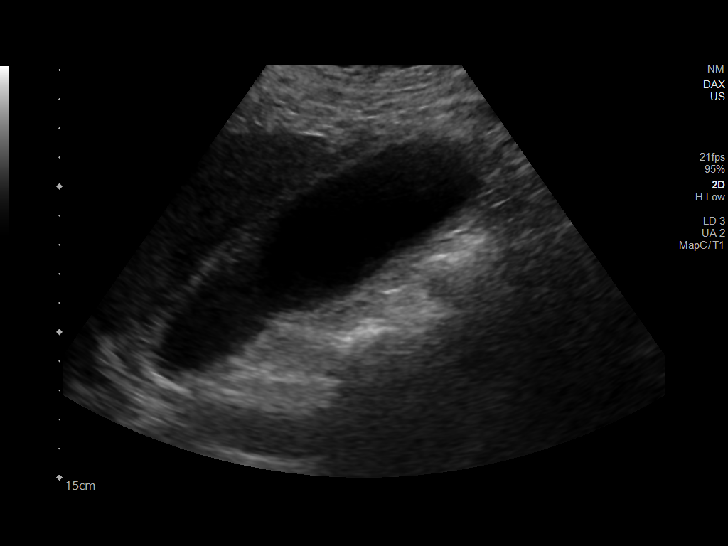
[im 10/58]
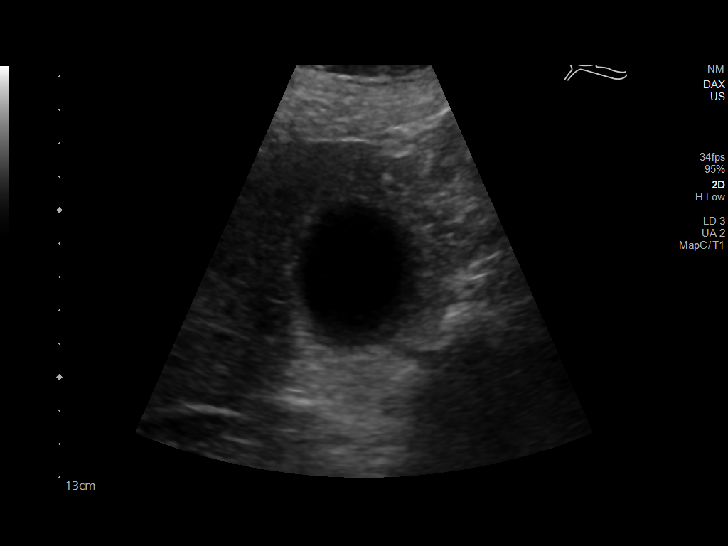
[im 15/58]
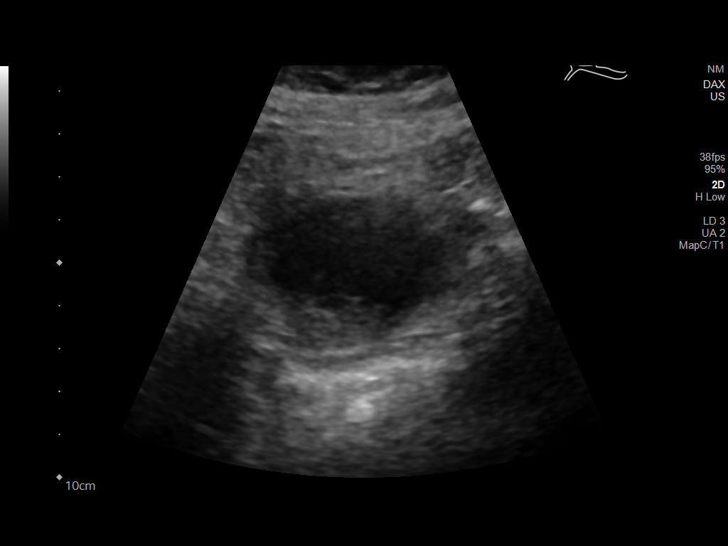
[im 20/58]
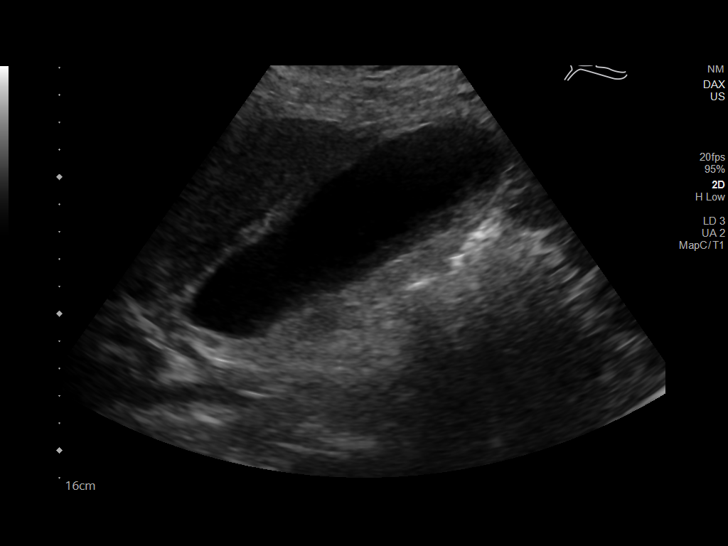
[im 22/58]
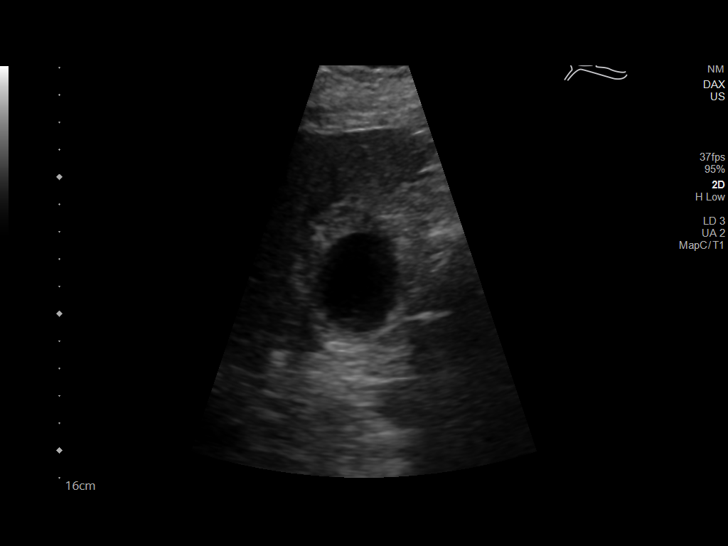
[im 27/58]
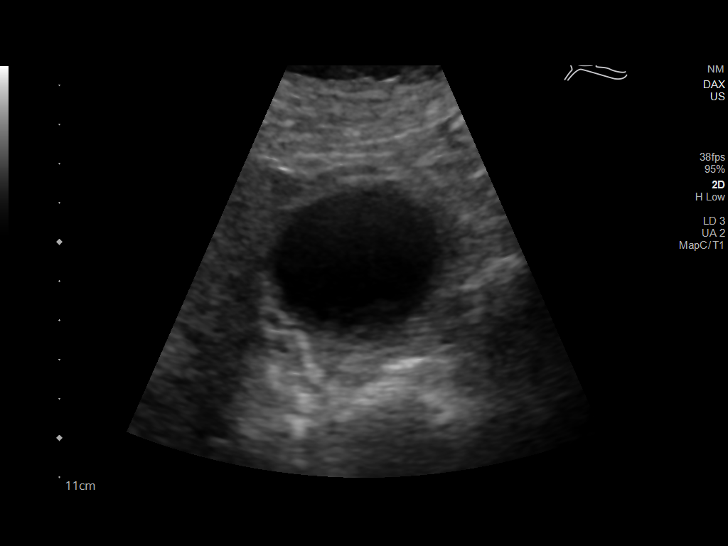
[im 31/58]
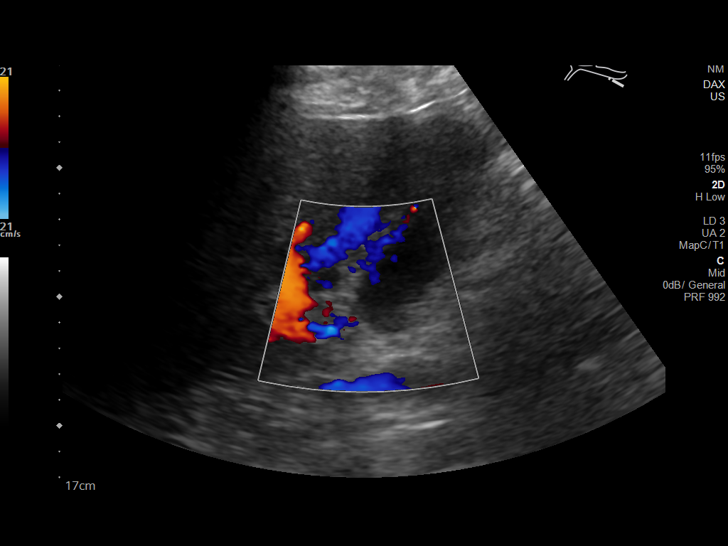
[im 36/58]
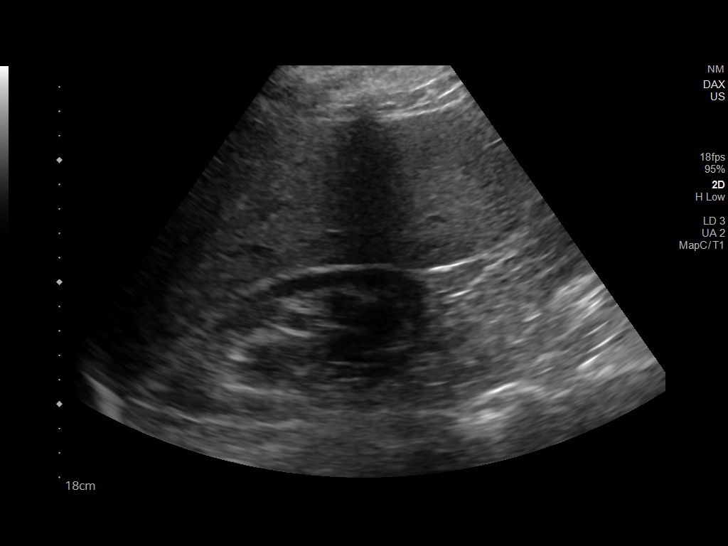
[im 39/58]
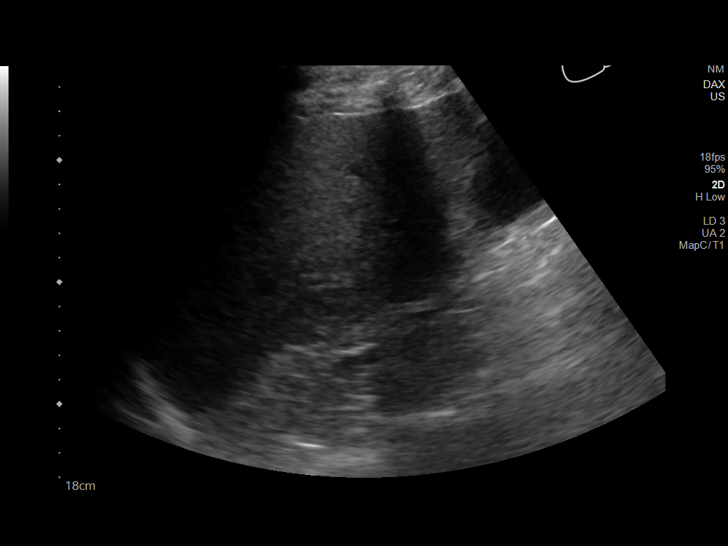
[im 43/58]
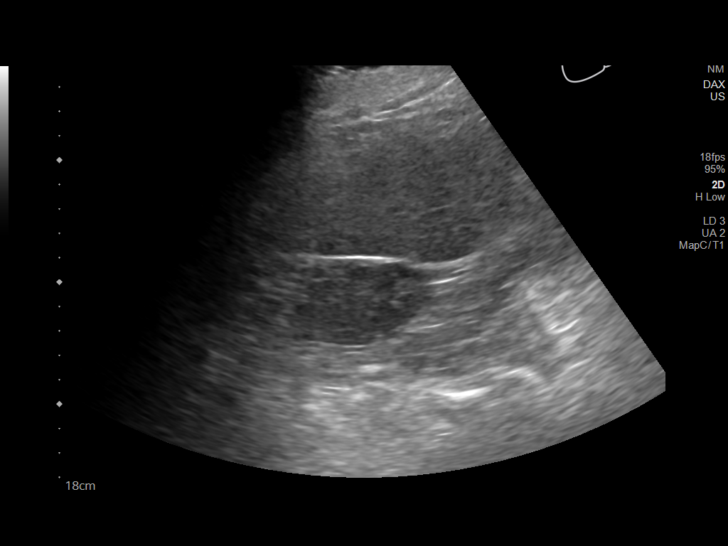
[im 48/58]
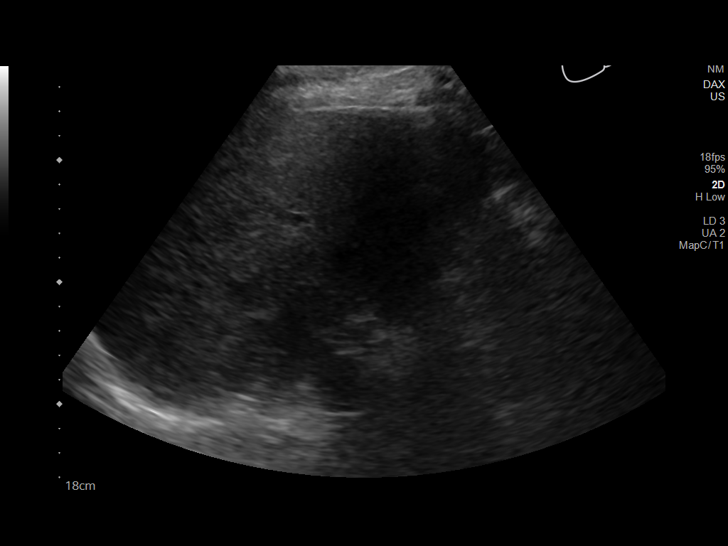
[im 53/58]
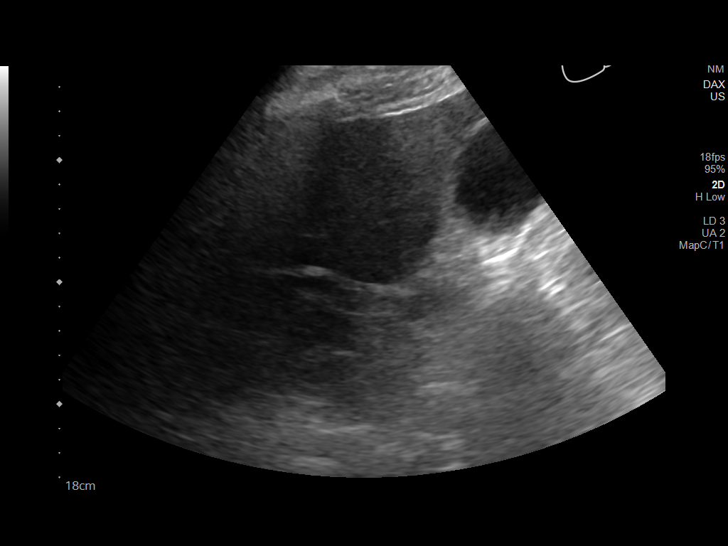
[im 58/58]
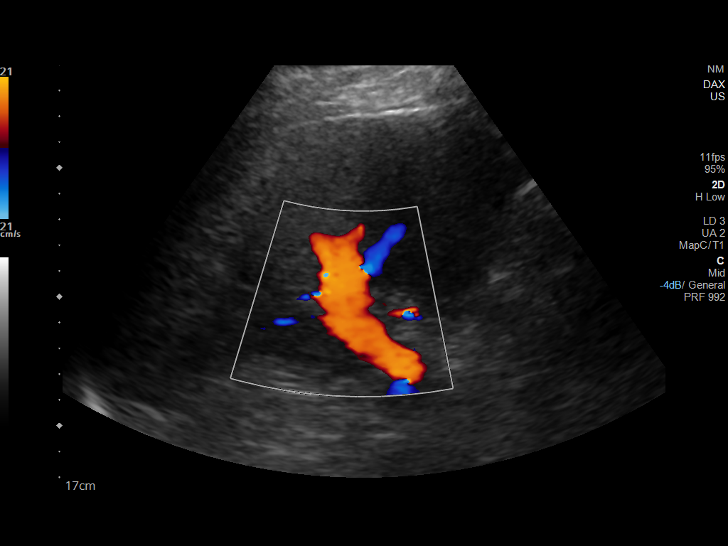

[14 of 25 positions shown; findings below may reference images not displayed]

FINDINGS: Gallbladder:

There is diffuse gallbladder wall thickening with the gallbladder
wall measuring approximately 7 mm in thickness. The sonographic
Murphy sign is negative. There is pericholecystic free fluid and
gallbladder sludge.

Common bile duct:

Diameter: 0.2 cm. The majority of the common bile duct was poorly
evaluated on this exam secondary to overlying bowel gas and
suboptimal sonographic windows.

Liver:

No focal lesion identified. Within normal limits in parenchymal
echogenicity. Portal vein is patent on color Doppler imaging with
normal direction of blood flow towards the liver.

Multiple right renal peripelvic cysts are noted.
IMPRESSION: Gallbladder sludge with pericholecystic free fluid and gallbladder
wall thickening. However, the sonographic Murphy sign is reported as
negative. Overall findings are equivocal for acute cholecystitis. If
there is clinical suspicion for acute cholecystitis follow-up with
HIDA scan is recommended. Consider surgical consultation.

## 2020-06-11 IMAGING — CR PORTABLE CHEST - 1 VIEW
2 series · 2 of 2 positions shown · non-contrast
Comparison: 05/09/2018

CLINICAL DATA: Pt reports stomach ache and fever of
yesterday. Went to UC and was sent here due to temp of 102.2, HR
120, oxygen saturation 89%. Denies cough or SOB. Pt is a former
smoker

EXAM:
PORTABLE CHEST 1 VIEW

[AP (1 of 2)]
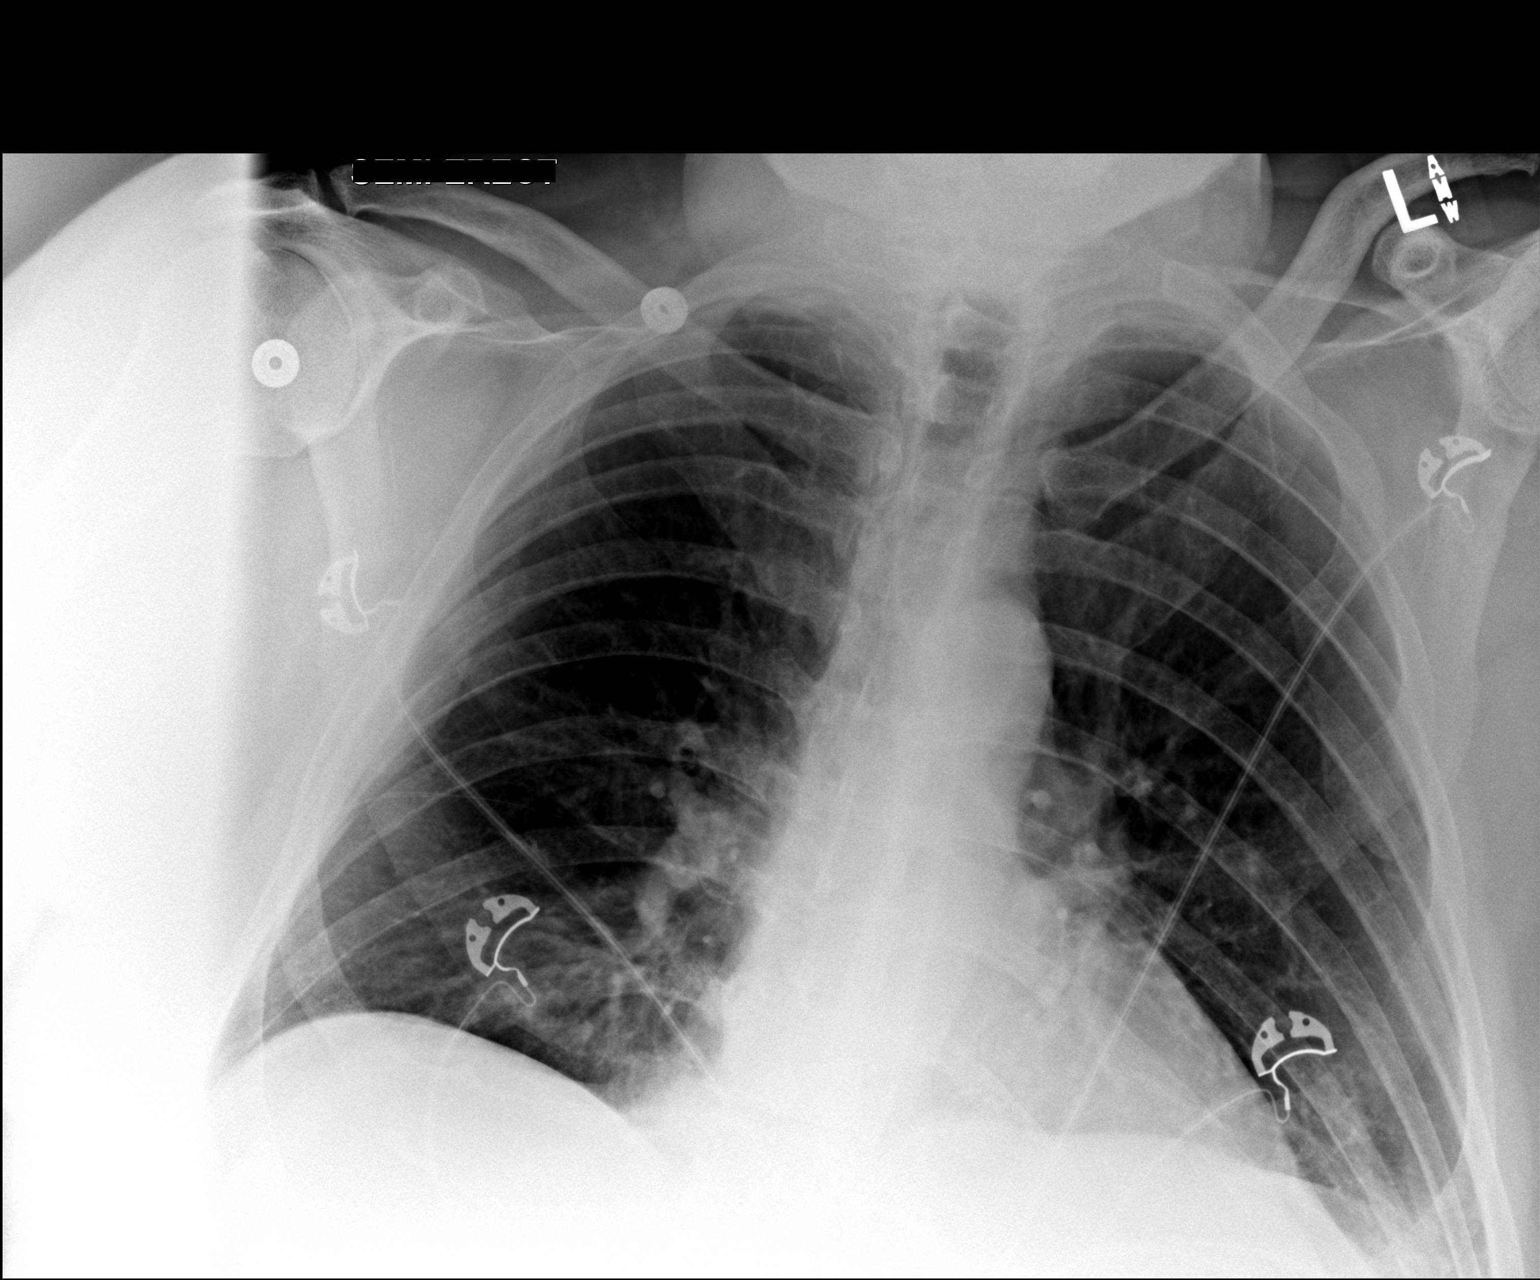

[AP (2 of 2)]
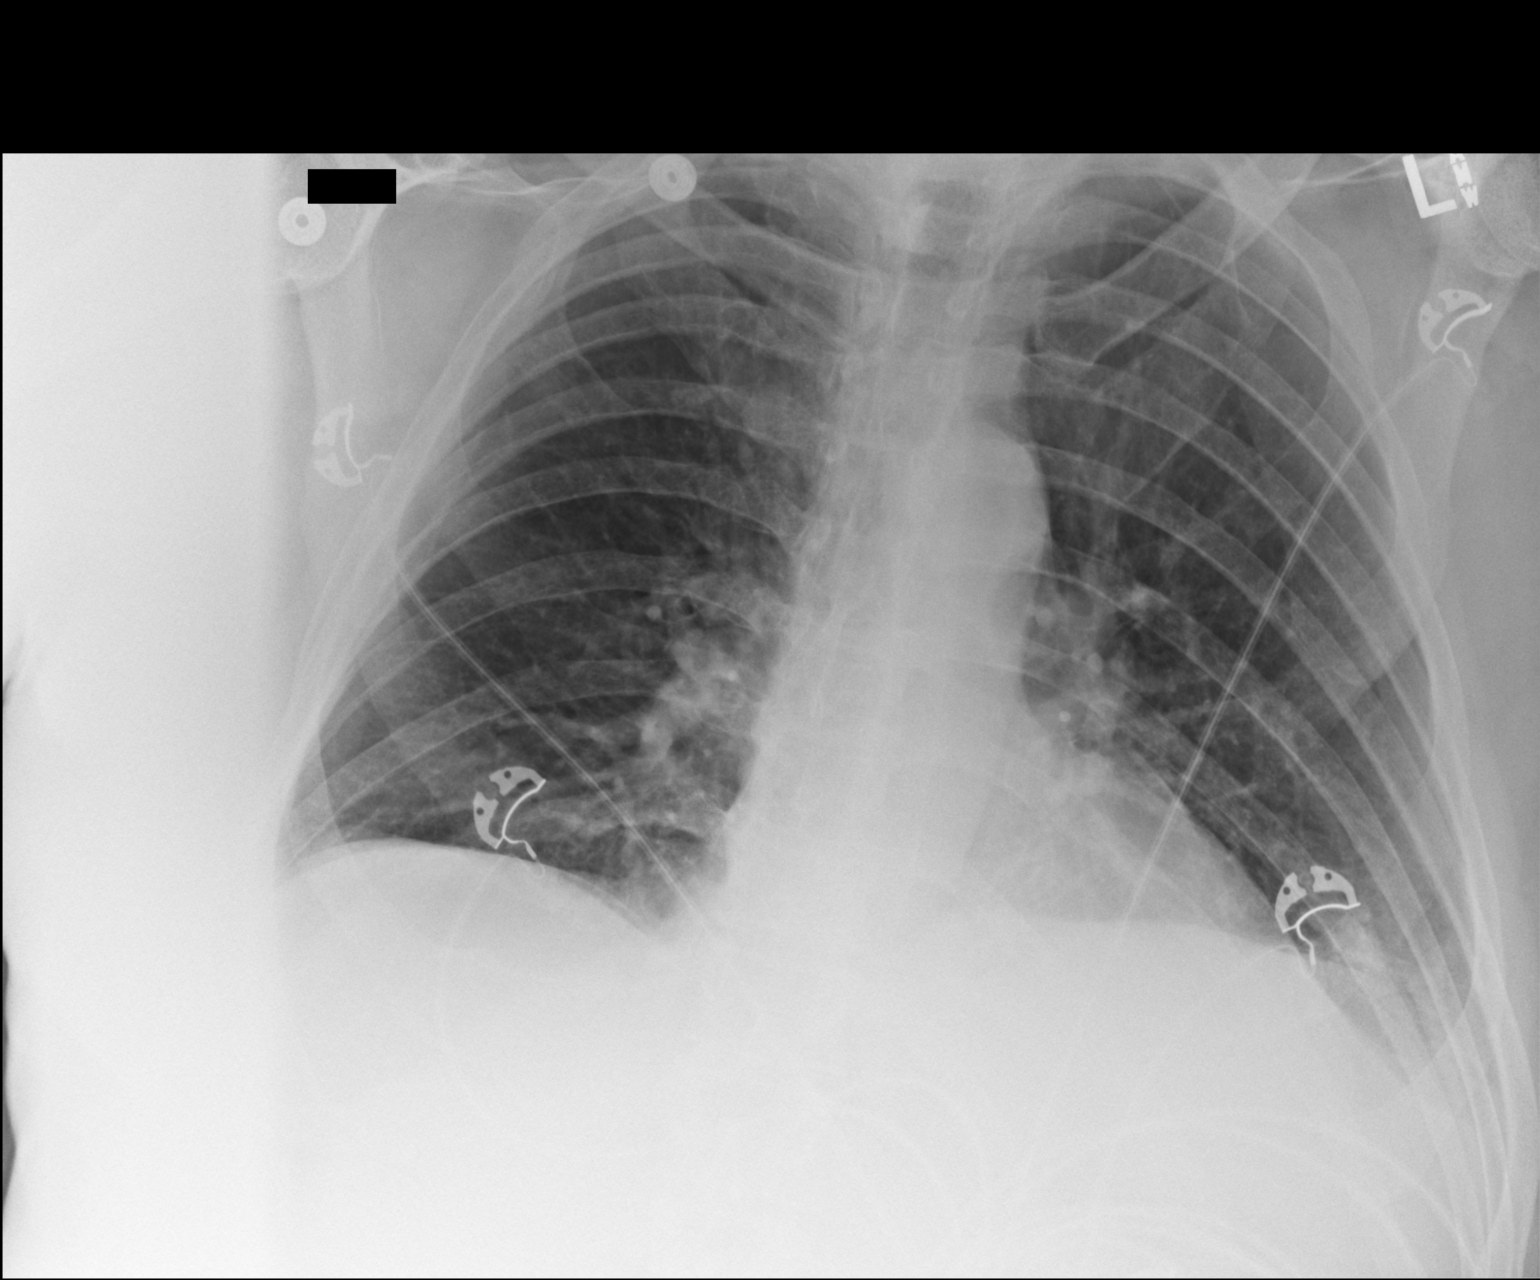

[2 of 2 positions shown; findings below may reference images not displayed]

FINDINGS: Cardiac silhouette is normal in size. No mediastinal or hilar masses
or evidence of adenopathy.

Mild opacity noted at both lung bases, most likely atelectasis.
Cannot exclude infection. Remainder of the lungs is clear. No
convincing pleural effusion. No pneumothorax.

Skeletal structures are grossly intact.
IMPRESSION: 1. Mild lung base opacity, most likely due to atelectasis. Pneumonia
is possible but felt less likely. No other evidence of acute
cardiopulmonary disease.

## 2020-06-11 IMAGING — CT CT ABDOMEN AND PELVIS WITH CONTRAST
2 of 5 series · 16 of 46 positions shown, 18 images · IV contrast (APPLIED)
Comparison: None available

CLINICAL DATA: Severe abdominal pain and nausea yesterday, fever
today, suspected diverticulitis

EXAM:
CT ABDOMEN AND PELVIS WITH CONTRAST
TECHNIQUE: Multidetector CT imaging of the abdomen and pelvis was performed
using the standard protocol following bolus administration of
intravenous contrast. Sagittal and coronal MPR images reconstructed
from axial data set.
CONTRAST:  100mL OMNIPAQUE IOHEXOL 300 MG/ML SOLN IV. No oral
contrast.

[Series 3: abd/ pelvis 5.0 i30f 2 · axial · 0.83mm/px · z∈[+698,+1148]mm · 13 of 102 slices shown, 15 images]
[im 6/102  soft-tissue]
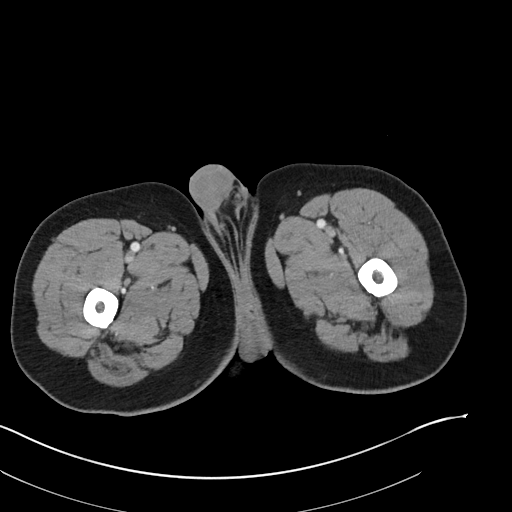
[im 6/102  bone]
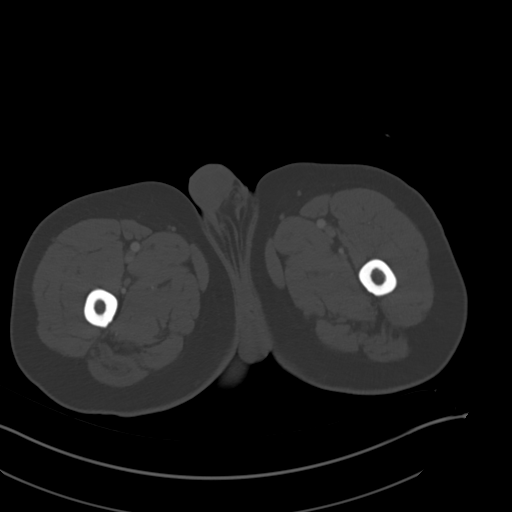
[im 16/102  soft-tissue]
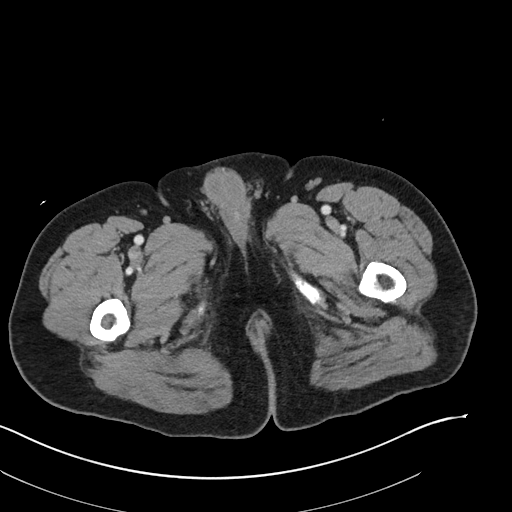
[im 22/102  soft-tissue]
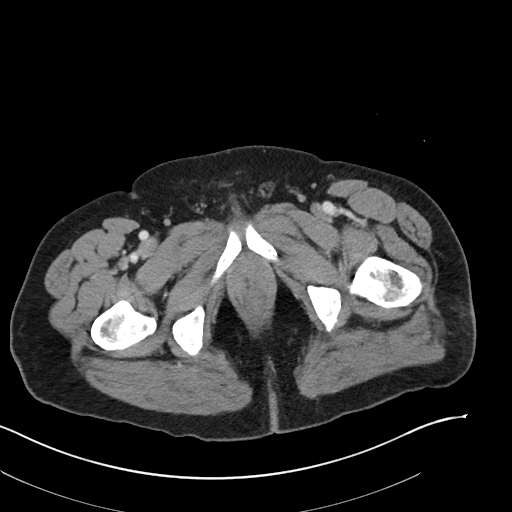
[im 27/102  soft-tissue]
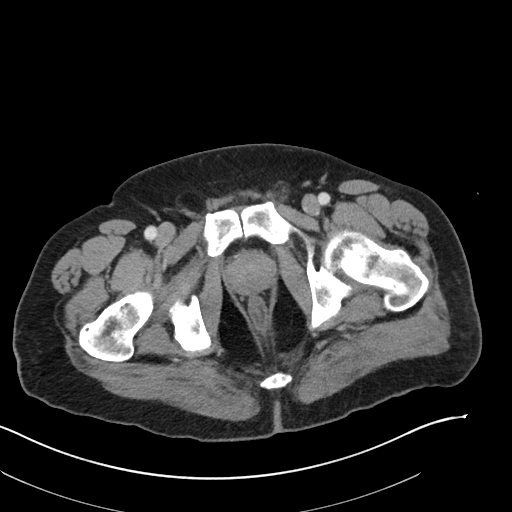
[im 38/102  soft-tissue]
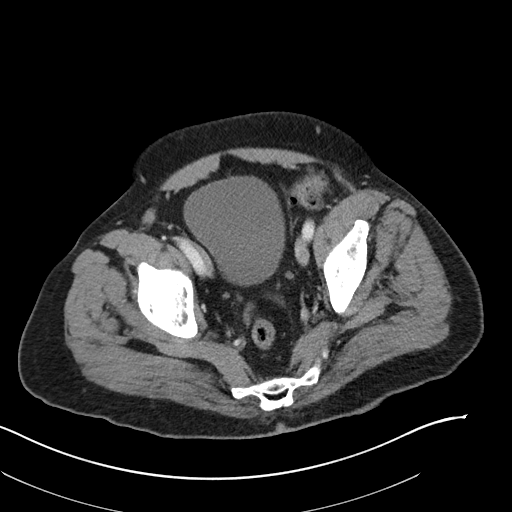
[im 43/102  soft-tissue]
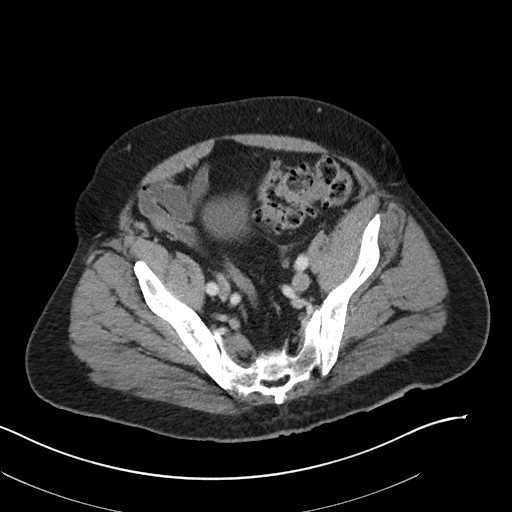
[im 54/102  soft-tissue]
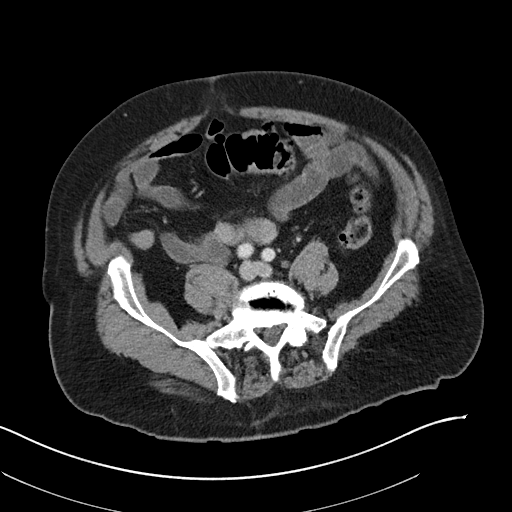
[im 59/102  soft-tissue]
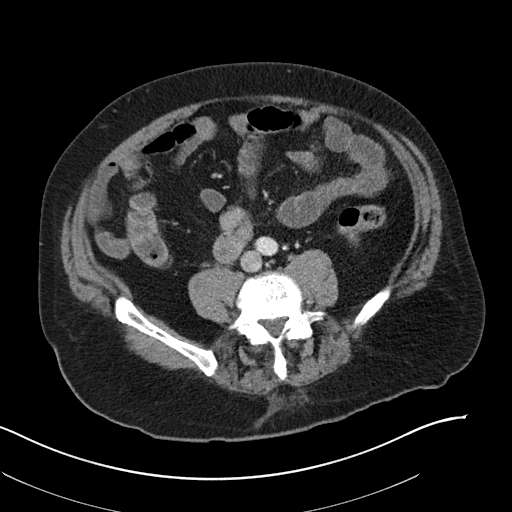
[im 64/102  soft-tissue]
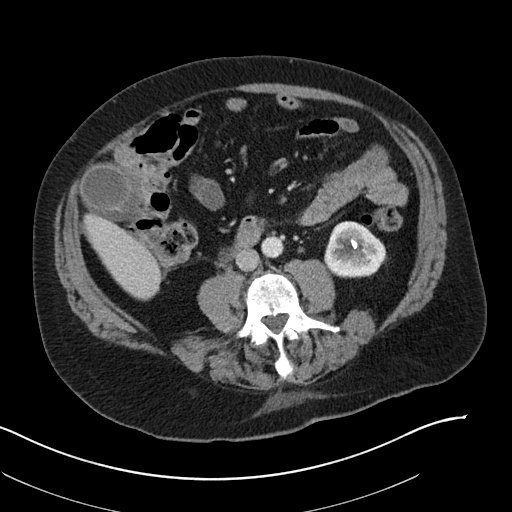
[im 64/102  bone]
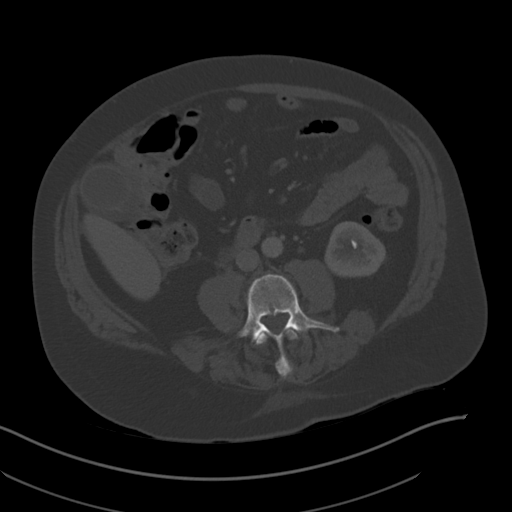
[im 75/102  soft-tissue]
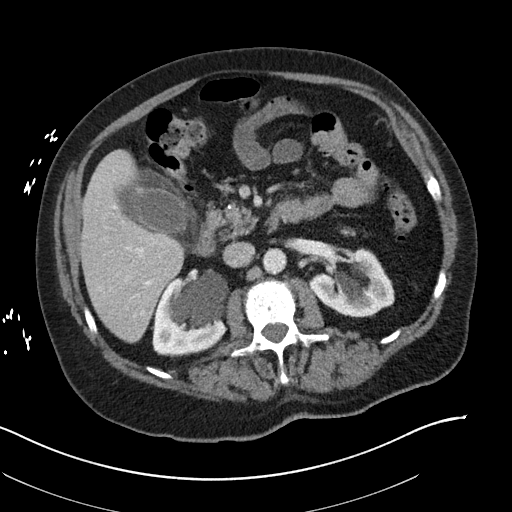
[im 80/102  soft-tissue]
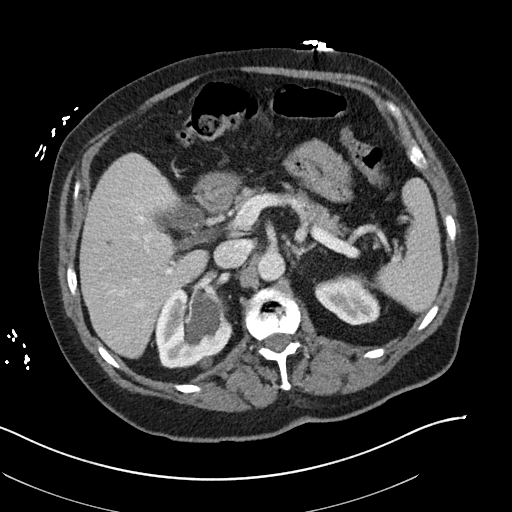
[im 86/102  soft-tissue]
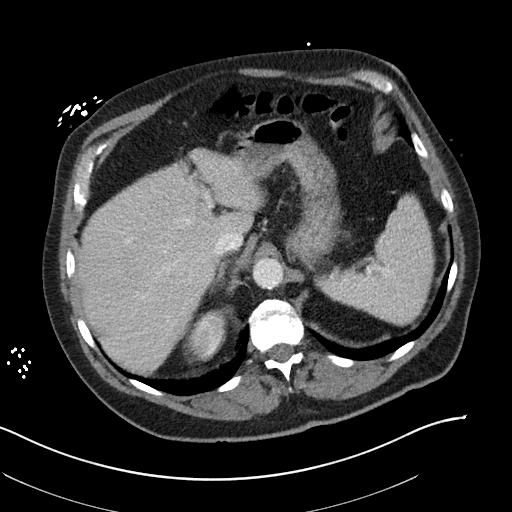
[im 96/102  soft-tissue]
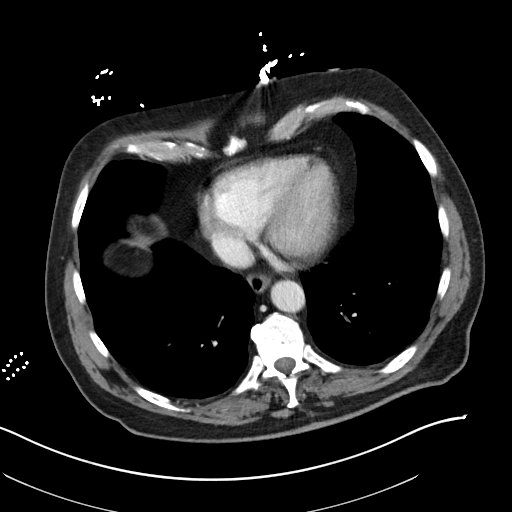

[Series 6: coronal soft tissue · coronal · 0.91mm/px · 3 of 113 slices shown]
[im 38/113  soft-tissue]
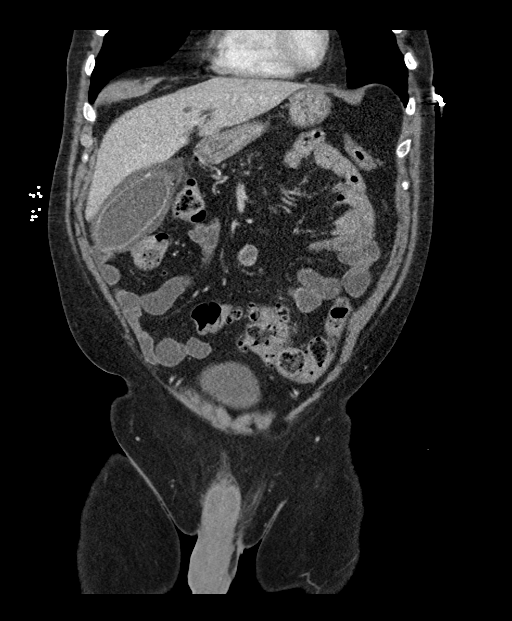
[im 50/113  soft-tissue]
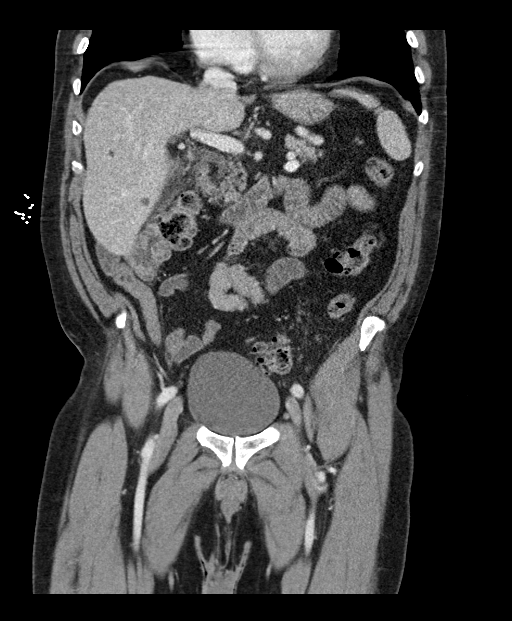
[im 63/113  soft-tissue]
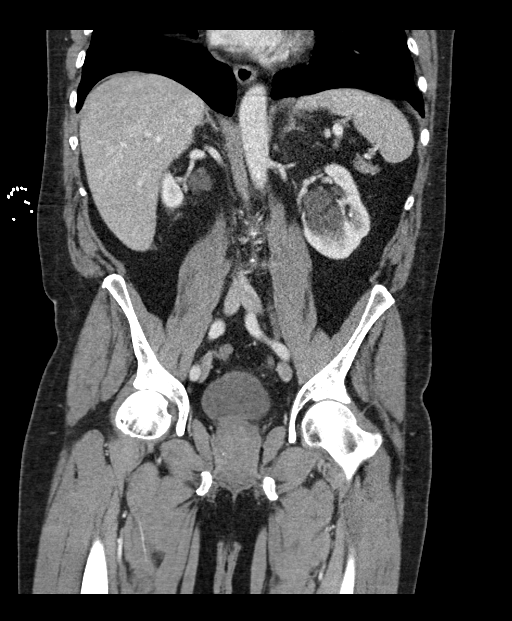

[16 of 46 positions shown; findings below may reference images not displayed]

FINDINGS: Lower chest: Small BILATERAL posteromedial diaphragmatic hernias
containing fat, Bochdalek's type. Lung bases clear.

Hepatobiliary: Small hepatic cysts. No other hepatic mass lesions or
biliary dilatation. Markedly thickened gallbladder wall and mild
surrounding pericholecystic edema highly suspicious for acute
cholecystitis.

Pancreas: Normal appearance

Spleen: Normal appearance

Adrenals/Urinary Tract: Adrenal glands normal appearance. BILATERAL
nonobstructing renal calculi. Multiple BILATERAL peripelvic renal
cysts. No hydronephrosis or hydroureter. Bladder unremarkable.

Stomach/Bowel: Normal appendix. Scattered colonic diverticulosis of
descending and sigmoid colon without evidence of diverticulitis.
Stomach and remaining bowel loops unremarkable.

Vascular/Lymphatic: Atherosclerotic calcifications aorta without
aneurysm. No adenopathy.

Reproductive: Unremarkable prostate gland and seminal vesicles

Other: Small umbilical hernia containing fat. No free air or free
fluid.

Musculoskeletal: Degenerative disc disease changes lumbar spine.
IMPRESSION: Distal colonic diverticulosis without evidence of diverticulitis.

Markedly thickened gallbladder wall with mild pericholecystic edema
highly suspicious for acute cholecystitis; recommend ultrasound
assessment.

BILATERAL peripelvic renal cysts and nonobstructing renal calculi.

Small BILATERAL posterior Bochdalek's type diaphragmatic hernias
containing fat.

Small umbilical hernia containing fat.
